# Patient Record
Sex: Female | Born: 1975
Health system: Southern US, Community
[De-identification: ages and names within clinical notes are randomized; demographics above are authoritative.]

## PROBLEM LIST (undated history)

## (undated) DIAGNOSIS — Z8632 Personal history of gestational diabetes: Secondary | ICD-10-CM

## (undated) DIAGNOSIS — M722 Plantar fascial fibromatosis: Secondary | ICD-10-CM

## (undated) HISTORY — DX: Personal history of gestational diabetes: Z86.32

## (undated) HISTORY — DX: Plantar fascial fibromatosis: M72.2

## (undated) HISTORY — PX: TUBAL LIGATION: SHX77

---

## 2003-03-19 ENCOUNTER — Other Ambulatory Visit: Admission: RE | Admit: 2003-03-19 | Discharge: 2003-03-19 | Payer: Self-pay | Admitting: Gynecology

## 2004-08-12 ENCOUNTER — Other Ambulatory Visit: Admission: RE | Admit: 2004-08-12 | Discharge: 2004-08-12 | Payer: Self-pay | Admitting: Gynecology

## 2005-08-16 ENCOUNTER — Other Ambulatory Visit: Admission: RE | Admit: 2005-08-16 | Discharge: 2005-08-16 | Payer: Self-pay | Admitting: Gynecology

## 2006-04-20 ENCOUNTER — Other Ambulatory Visit: Admission: RE | Admit: 2006-04-20 | Discharge: 2006-04-20 | Payer: Self-pay | Admitting: Gynecology

## 2006-08-14 ENCOUNTER — Encounter: Admission: RE | Admit: 2006-08-14 | Discharge: 2006-11-12 | Payer: Self-pay | Admitting: Gynecology

## 2006-11-06 ENCOUNTER — Inpatient Hospital Stay (HOSPITAL_COMMUNITY): Admission: AD | Admit: 2006-11-06 | Discharge: 2006-11-10 | Payer: Self-pay | Admitting: Gynecology

## 2006-11-07 ENCOUNTER — Encounter (INDEPENDENT_AMBULATORY_CARE_PROVIDER_SITE_OTHER): Payer: Self-pay | Admitting: Specialist

## 2006-12-12 ENCOUNTER — Other Ambulatory Visit: Admission: RE | Admit: 2006-12-12 | Discharge: 2006-12-12 | Payer: Self-pay | Admitting: Gynecology

## 2008-04-25 ENCOUNTER — Other Ambulatory Visit: Admission: RE | Admit: 2008-04-25 | Discharge: 2008-04-25 | Payer: Self-pay | Admitting: Gynecology

## 2008-05-08 ENCOUNTER — Ambulatory Visit: Payer: Self-pay | Admitting: Gynecology

## 2009-01-26 ENCOUNTER — Ambulatory Visit: Payer: Self-pay | Admitting: Gynecology

## 2009-05-22 ENCOUNTER — Encounter: Payer: Self-pay | Admitting: Gynecology

## 2009-05-22 ENCOUNTER — Ambulatory Visit: Payer: Self-pay | Admitting: Gynecology

## 2009-05-22 ENCOUNTER — Other Ambulatory Visit: Admission: RE | Admit: 2009-05-22 | Discharge: 2009-05-22 | Payer: Self-pay | Admitting: Gynecology

## 2009-12-21 ENCOUNTER — Ambulatory Visit: Payer: Self-pay | Admitting: Gynecology

## 2010-06-04 ENCOUNTER — Other Ambulatory Visit: Admission: RE | Admit: 2010-06-04 | Discharge: 2010-06-04 | Payer: Self-pay | Admitting: Gynecology

## 2010-06-04 ENCOUNTER — Ambulatory Visit: Payer: Self-pay | Admitting: Gynecology

## 2011-01-21 NOTE — H&P (Signed)
NAME:  Anne Calhoun, Anne Calhoun NO.:  [id number]   MEDICAL RECORD NO.:  [id number]          PATIENT TYPE:  INP   LOCATION:                                FACILITY:  [facility]   PHYSICIAN:  Juan H. Lily Peer, M.D.DATE OF BIRTH:  1976-06-20   DATE OF ADMISSION:  11/06/2006  DATE OF DISCHARGE:                              HISTORY & PHYSICAL   SCHEDULED DATE OF ADMISSION:  Monday, November 06, 2006   CHIEF COMPLAINT:  1. Term intrauterine pregnancy at [redacted] weeks gestation.  2. Gestational diabetes, diet control.   HISTORY:  The patient is a 35 year old gravida 1, para 0; estimated date  of confinement of November 06, 2006.  She will be admitted to [facility] on the evening of March 3 for cervical ripening followed by  Pitocin due to being [redacted] weeks gestation and gestational diabetic.  She  has been doing well with her diet and her blood sugars have been  monitored and followed closely with antepartum testing consisting of  weekly nonstress tests, and her last ultrasound done in the office on  February 29 demonstrated estimated fetal weight of 7 pounds 8 ounces in  the 38th percentile and AFI was normal, 12%, fetus in the vertex  presentation.  Cardiac activity was noted.  Her cervix was long, closed  and posterior.  The patient's prenatal course essentially otherwise had  been unremarkable.   PAST MEDICAL HISTORY:  The patient denies any allergies.   REVIEW OF SYSTEMS:  See [employer] form.   PHYSICAL EXAMINATION:  VITAL SIGNS:  The patient's blood pressure was  118/74, weight 226 pounds.  HEENT:  Unremarkable.  NECK:  Supple, trachea midline.  No carotid bruits, no thyromegaly.  LUNGS:  Clear to auscultation without rhonchi or wheezes.  HEART:  Regular rate and rhythm.  No murmurs or gallops.  BREAST:  Exam not done.  ABDOMEN:  Gravid uterus, vertex presentation by [facility] maneuver and  confirmed by ultrasound today.  PELVIC:  Cervix was long and closed and posterior.  EXTREMITIES:  DTRs 1+, negative clonus.   PRENATAL LABORATORY DATA:  The patient had a normal cystic fibrosis  screening as well as hemoglobin electrophoresis and norma alpha-  fetoprotein.  Her blood type is O positive, negative antibody screen,  VDRL was nonreactive, rubella immune, hepatitis B surface antigen and  HIV were negative, diabetes screen was abnormal as was a 3-hour GTT and  her GBS culture was positive.   ASSESSMENT:  A 35 year old gravida 1, para 0 at [redacted] weeks gestation will  be admitted on the evening of March 3 for cervical ripening, followed by  Pitocin the following morning.  The patient is a gestational diabetic,  diet control.  The risks, benefits, and pros and cons of induction were  discussed.  She does have positive group B strep culture for which she  will be place on penicillin for GBS prophylaxis.  She has no allergies.  The patient was informed that she  potentially could end up with a serial induction due to her cervix being  unfavorable.  All questions were answered and  will follow accordingly.   PLAN:  As per assessment above.      Juan H. Lily Peer, M.D.  Electronically Signed     JHF/MEDQ  D:  11/03/2006  T:  11/03/2006  Job:  [id number]

## 2011-01-21 NOTE — Discharge Summary (Signed)
NAME:  Anne Calhoun, Anne Calhoun NO.:  [id number]   MEDICAL RECORD NO.:  [id number]          PATIENT TYPE:  INP   LOCATION:  9126                          [facility]ACILITY:  [facility]   PHYSICIAN:  Juan H. Lily Peer, M.D.DATE OF BIRTH:  April 02, 1976   DATE OF ADMISSION:  11/06/2006  DATE OF DISCHARGE:  11/10/2006                               DISCHARGE SUMMARY   Total days hospitalized were four.   HISTORY:  The patient is a 35 year old gravida 1, now para 1, who was  admitted on March 3 for induction at 40 weeks' gestation, gestational  diabetes. The patient was taken for a primary cesarean section secondary  to nonreassuring fetal heart tracing (repetitive late deceleration). The  patient developed a female infant with Apgars of 9 and 9. There was a  nuchal cord at the time of delivery, and the baby weighed 6 pounds 11  ounces and an arterial cord pH of 7.18. Subserosal fibroids have been  noted at the time of cesarean section, and the patient's blood loss was  500 mL. Postoperatively, the patient due to her obesity may have had  difficulty with her O2 saturations. They were ranging in the 80s to 90s.  She was kept on O2 nasal cannula. Chest x-ray had been done, and  atelectasis was noted. She was given a touch of Lasix which increased  her urine output and improved her breathing, and the patient was  ambulating, voiding well, and tolerating a regular diet well. Her  electrolytes were fine as well, and she was ready to be discharged home  on her third postoperative day.   FINAL DIAGNOSES:  1. Post-dates pregnancy.  2. Gestational diabetes, diet controlled.  3. Postoperative atelectasis.   PROCEDURE PERFORMED:  1. Fetal monitoring.  2. Primary lower uterine segment transverse cesarean section.   FINAL DISPOSITION AND FOLLOWUP:  The patient will be discharged home on  third postoperative day. She will return back to the office next week to  have her staples removed. She was given a  prescription of Motrin 800 mg  to take 1 p.o. t.i.d. p.r.n. She is to continue her prenatal vitamins as  she is breast feeding, and I have given her a prescription also for  Tylox to take 1 p.o. q.4-6h. p.r.n. pain.      Juan H. Lily Peer, M.D.  Electronically Signed     JHF/MEDQ  D:  11/10/2006  T:  11/10/2006  Job:  [id number]

## 2011-01-21 NOTE — Op Note (Signed)
NAME:  Anne Calhoun, Anne Calhoun NO.:  [id number]   MEDICAL RECORD NO.:  [id number]          PATIENT TYPE:  INP   LOCATION:  9126                          [facility]ILITY[facility]  [facility]   PHYSICIAN:  Juan H. Lily Peer, M.D.DATE OF BIRTH:  [birth year]   DATE OF PROCEDURE:  11/07/2006  DATE OF DISCHARGE:                               OPERATIVE REPORT   INDICATION FOR OPERATION:  A 35 year old gravida 1, para 0, who was  admitted for induction.  She was at term at 32 weeks' gestation with  gestational diabetes, diet-controlled.  The patient is being taken to  the operating room secondary to non-reassuring fetal heart tracing  secondary to repetitive late decelerations.   PREOPERATIVE DIAGNOSES:  1. Postdate pregnancy.  2. Non-reassuring fetal heart rate tracing (repetitive late      decelerations).   POSTOPERATIVE DIAGNOSES:  1. Postdate pregnancy.  2. Non-reassuring fetal heart rate tracing (repetitive late      decelerations).   SURGEON:  Juan H. Lily Peer, M.D.   PROCEDURE PERFORMED:  Primary lower uterine segment transverse cesarean  section.   ANESTHESIA:  Epidural.   FINDINGS:  A viable female infant with a nuchal cord, Apgars of 9 and 9,  weight of 5 pounds 11 ounces, arterial cord pH of 7.18.  Clear amniotic  fluid.  Patient with several subserosal leiomyomas, especially in  anterior and posterior fundal uterus.  The patient did donate her cord  blood for public blood banking.   DESCRIPTION OF OPERATION:  After the patient was adequately counseled,  she was taken to the operating room, where she was taken immediately for  emergency cesarean section.  She was redosed for her epidural.  Foley  catheter was in place.  Fetal heart tones were in the 130s prior to  starting the cesarean section.  The scalp electrode was removed.  The  abdomen was prepped and draped in the usual sterile fashion.  A  Pfannenstiel skin incision was made 2 cm above the symphysis pubis.  The  incision was carried down from the skin and subcutaneous tissue down to  the rectus fascia, whereby a midline nick was made.  The fascia was  incised in a transverse fashion and the midline raphae was entered.  The  peritoneal cavity was entered cautiously.  The bladder flap was  established and the lower uterine segment was incised in a transverse  fashion.  Clear amniotic fluid was present.  The newborn's head was  delivered after manually reducing the nuchal cord.  The nasopharyngeal  area was bulb-suctioned and the baby was delivered.  The cord was doubly  clamped and excised and passed off to the neonatologists who were in  attendance, who gave the above-mentioned parameters.  After cord blood  was obtained, the placenta was delivered from the intrauterine cavity  and was submitted for histological evaluation.  Pitocin drip was started  and the patient had received a gram of cefoxitin.  The uterus was  exteriorized.  The intrauterine cavity was swept clear of remaining  products of conception.  The lower uterine segment transverse incision  was closed in a double-layered  fashion, the first with an interlocking  stitch of 0 Vicryl suture, the second layer in an imbricating manner  with 0 Vicryl suture.  The tubes and ovaries appeared to be normal.  The  uterus was placed back into the pelvic cavity.  The pelvic cavity was  copiously irrigated with normal saline solution.  After ascertaining  adequate hemostasis, closure was started.  The visceroperitoneum was not  closed, but the rectus fascia was closed with a running stitch of 0  Vicryl.  The  subcutaneous bleeders were bovie-cauterized.  The skin was  reapproximated with skin clips followed by placement of Xeroform gauze  and 4 x 8 dressing.  The patient was transferred to recovery room with  stable vital signs.  Blood loss from procedure was 500 mL.  IV fluids  were 2 L of lactated Ringer's.  Urine output was 100 mL.      Juan  H. Lily Peer, M.D.  Electronically Signed     JHF/MEDQ  D:  11/08/2006  T:  11/08/2006  Job:  [id number]

## 2011-05-31 ENCOUNTER — Ambulatory Visit (INDEPENDENT_AMBULATORY_CARE_PROVIDER_SITE_OTHER): Payer: 59 | Admitting: Gynecology

## 2011-05-31 ENCOUNTER — Encounter: Payer: Self-pay | Admitting: Gynecology

## 2011-05-31 ENCOUNTER — Encounter: Payer: Self-pay | Admitting: Anesthesiology

## 2011-05-31 VITALS — BP 124/86

## 2011-05-31 DIAGNOSIS — Z3201 Encounter for pregnancy test, result positive: Secondary | ICD-10-CM

## 2011-05-31 DIAGNOSIS — N912 Amenorrhea, unspecified: Secondary | ICD-10-CM

## 2011-05-31 LAB — POCT URINE PREGNANCY: Preg Test, Ur: POSITIVE

## 2011-05-31 NOTE — Patient Instructions (Signed)
Adisa, and granulations on your pregnancy once again. The blood test that your going to have drawn today the quantitative beta-hCG is to give [place] an idea how far your pregnancy is so that we can do an ultrasound and you that the pregnancy is in the uterus and given [place] a clear understanding of the due date. Based on your last menstrual period of August 21 year approximately 5-1/2 weeks in tear pregnancy with an estimated date of confinement or due date of 01/31/2012. We'll see the following week for the ultrasound and continue to take her prenatal vitamins.

## 2011-05-31 NOTE — Progress Notes (Signed)
Addended byCammie Mcgee T on: 05/31/2011 01:06 PM   Modules accepted: Orders

## 2011-05-31 NOTE — Progress Notes (Signed)
Patient's a 35 year old now gravida 2 para 1 who stated her last menstrual period was 04/26/2011. She had been on prenatal vitamins and was utilizing the ovulation predictor kit timed intercourse. Based on her last menstrual period she would be 5-1/2 weeks and her pregnancy with a due date of May 28 2013th. Review of her record indicated she had gestational diabetes with her last pregnancy and had delivered via cesarean section as a result of postdate pregnancy. She had delivered a viable female infant with a weight of 6 lbs. 11 oz. Patient denies any nausea vomiting any vaginal bleeding or any breast tenderness.  Exam: Abdomen: Soft nontender no rebound or guarding Bartholin urethra Skene glands: Unremarkable Vagina: No lesions or discharge Cervix: No lesion discharge Uterus: 4-6 weeks size uterus nontender Adnexa: No palpable masses or tenderness Rectal: Not done  Assessment/plan: Pregnant first trimester 5-1/2 weeks estimated gestational age. We'll obtain a quantitative beta-hCG and plan for an ultrasound next week to confirm dates and viability.

## 2011-06-07 ENCOUNTER — Other Ambulatory Visit: Payer: Self-pay | Admitting: Gynecology

## 2011-06-07 ENCOUNTER — Ambulatory Visit (INDEPENDENT_AMBULATORY_CARE_PROVIDER_SITE_OTHER): Payer: 59

## 2011-06-07 ENCOUNTER — Encounter: Payer: Self-pay | Admitting: Gynecology

## 2011-06-07 ENCOUNTER — Ambulatory Visit (INDEPENDENT_AMBULATORY_CARE_PROVIDER_SITE_OTHER): Payer: 59 | Admitting: Gynecology

## 2011-06-07 VITALS — BP 128/84

## 2011-06-07 DIAGNOSIS — N852 Hypertrophy of uterus: Secondary | ICD-10-CM

## 2011-06-07 DIAGNOSIS — N854 Malposition of uterus: Secondary | ICD-10-CM

## 2011-06-07 DIAGNOSIS — Z3201 Encounter for pregnancy test, result positive: Secondary | ICD-10-CM

## 2011-06-07 DIAGNOSIS — Z23 Encounter for immunization: Secondary | ICD-10-CM

## 2011-06-07 DIAGNOSIS — N912 Amenorrhea, unspecified: Secondary | ICD-10-CM

## 2011-06-07 DIAGNOSIS — N831 Corpus luteum cyst of ovary, unspecified side: Secondary | ICD-10-CM

## 2011-06-07 DIAGNOSIS — O34519 Maternal care for incarceration of gravid uterus, unspecified trimester: Secondary | ICD-10-CM

## 2011-06-07 DIAGNOSIS — O9989 Other specified diseases and conditions complicating pregnancy, childbirth and the puerperium: Secondary | ICD-10-CM

## 2011-06-07 DIAGNOSIS — O34539 Maternal care for retroversion of gravid uterus, unspecified trimester: Secondary | ICD-10-CM

## 2011-06-07 DIAGNOSIS — N83209 Unspecified ovarian cyst, unspecified side: Secondary | ICD-10-CM

## 2011-06-07 LAB — US OB TRANSVAGINAL

## 2011-06-07 NOTE — Progress Notes (Signed)
Patient's a 35 year old now gravida 2 para 1 who stated her last menstrual period was 04/26/2011. She had been on prenatal vitamins and was utilizing the ovulation predictor kit timed intercourse. Based on her last menstrual period she would be 5-1/2 weeks and her pregnancy with a due date of May 28 2013th. Review of her record indicated she had gestational diabetes with her last pregnancy and had delivered via cesarean section as a result of postdate pregnancy. She had delivered a viable female infant with a weight of 6 lbs. 11 oz. Patient denies any nausea vomiting any vaginal bleeding or any breast tenderness.  Patient return to the office today for an ultrasound to determine viability and correct estimated gestational age. She denied any vaginal bleeding. Her quantitative beta hCG of September 25 was 1113 milli-international units per mL. The ultrasound today demonstrated the following  Intrauterine pregnancy was noted with fetal pole and cardiac activity seen. Fetal pole size less than dates by last menstrual period. Left ovary small corpus luteum cyst measuring 24 x 21 mm and a thin walled echo free cyst measuring 4.7 x 3.8 x 4.2 cm negative color flow Doppler cervix was long and closed.  The above findings discussed with the patient. We'll refer patient to my obstetrical colleagues who will follow her during her pregnancy and we'll be monitoring this ovarian cyst. Patient will continue her prenatal vitamins. She'll make the OB appointment 2 weeks from today. We'll send a copy of this office note to my schedule colleagues.

## 2011-06-09 ENCOUNTER — Telehealth: Payer: Self-pay | Admitting: *Deleted

## 2011-06-09 NOTE — Telephone Encounter (Signed)
Pt called complaining of soreness and slight redress from her flu shot on 06/07/11. Lm on pt voice mail that this is normal after getting shot.

## 2011-06-20 LAB — OB RESULTS CONSOLE ANTIBODY SCREEN: Antibody Screen: NEGATIVE

## 2011-06-20 LAB — OB RESULTS CONSOLE HIV ANTIBODY (ROUTINE TESTING): HIV: NONREACTIVE

## 2011-06-20 LAB — OB RESULTS CONSOLE RPR: RPR: NONREACTIVE

## 2011-06-20 LAB — OB RESULTS CONSOLE GC/CHLAMYDIA
Chlamydia: NEGATIVE
Gonorrhea: NEGATIVE

## 2011-07-26 ENCOUNTER — Other Ambulatory Visit (HOSPITAL_COMMUNITY): Payer: Self-pay | Admitting: Obstetrics and Gynecology

## 2011-07-26 DIAGNOSIS — O289 Unspecified abnormal findings on antenatal screening of mother: Secondary | ICD-10-CM

## 2011-08-11 ENCOUNTER — Telehealth: Payer: Self-pay | Admitting: *Deleted

## 2011-08-11 NOTE — Telephone Encounter (Signed)
Left pt messages with 12-7 appointment time and address with call back number. Faxed letter to referring

## 2011-08-11 NOTE — Telephone Encounter (Signed)
Per Sue Lush referring Pt is aware of 12-7 appointment

## 2011-08-12 ENCOUNTER — Other Ambulatory Visit: Payer: Self-pay

## 2011-08-12 ENCOUNTER — Ambulatory Visit (HOSPITAL_BASED_OUTPATIENT_CLINIC_OR_DEPARTMENT_OTHER): Payer: 59 | Admitting: Hematology & Oncology

## 2011-08-12 ENCOUNTER — Ambulatory Visit (HOSPITAL_BASED_OUTPATIENT_CLINIC_OR_DEPARTMENT_OTHER): Payer: 59

## 2011-08-12 ENCOUNTER — Other Ambulatory Visit (HOSPITAL_BASED_OUTPATIENT_CLINIC_OR_DEPARTMENT_OTHER): Payer: 59 | Admitting: Lab

## 2011-08-12 VITALS — BP 117/84 | HR 70 | Temp 98.2°F | Ht 60.0 in | Wt 217.0 lb

## 2011-08-12 DIAGNOSIS — D72829 Elevated white blood cell count, unspecified: Secondary | ICD-10-CM

## 2011-08-12 DIAGNOSIS — O289 Unspecified abnormal findings on antenatal screening of mother: Secondary | ICD-10-CM

## 2011-08-12 DIAGNOSIS — O99891 Other specified diseases and conditions complicating pregnancy: Secondary | ICD-10-CM

## 2011-08-12 LAB — CBC WITH DIFFERENTIAL (CANCER CENTER ONLY)
BASO#: 0 10*3/uL (ref 0.0–0.2)
Eosinophils Absolute: 0.1 10*3/uL (ref 0.0–0.5)
HCT: 39 % (ref 34.8–46.6)
LYMPH%: 17.4 % (ref 14.0–48.0)
MCH: 31.3 pg (ref 26.0–34.0)
MCHC: 34.6 g/dL (ref 32.0–36.0)
MONO#: 0.7 10*3/uL (ref 0.1–0.9)
MONO%: 3.6 % (ref 0.0–13.0)
Platelets: 309 10*3/uL (ref 145–400)
RDW: 12.3 % (ref 11.1–15.7)

## 2011-08-12 NOTE — Progress Notes (Signed)
This office note has been dictated.

## 2011-08-18 NOTE — Progress Notes (Signed)
CC:   Maxie Better, M.D.  DIAGNOSES: 1. Leukocytosis. 2. Second trimester pregnancy.  HISTORY OF PRESENT ILLNESS:  Ms. Hogue is a very nice 35 year old African American female.  She is followed by Dr. Maxie Better. She is in her 15th week of pregnancy.  This is her 2nd pregnancy. Her 1st pregnancy was 4 years ago.  She had no problems with that pregnancy. I think she had borderline diabetes.  Her baby, a girl, was of normal birth weight.  With this pregnancy, Dr. Cherly Hensen has noted a leukocytosis.  As such, she felt that a Hematology evaluation was indicated.  Back in October, a CBC was done which showed a white count 17.6. Hemoglobin was 14 and platelet count 364.  MCV is 92.  White cell differential showed 80 segs, 16 lymphocytes.  She was negative for HIV. She was negative for hepatitis B.  She did have a followup CBC done.  This was done in November.  This showed a white count of 15.9, hemoglobin 13.6, hematocrit  39, and platelet count 347.  White cell differential was 77 segs, 18 lymphocytes.  Ms. Grieshop was never told that her white cell count has been elevated. She was not sure if there was any issue with her 1st pregnancy.  She has had no problem with fever.  She has had no rashes.  She has had no change in bowel or bladder habits.  She only on prenatal vitamins. She has had no swallowing difficulties.  There is no headache.  She has had no swelling of her joints. As such, Dr. Cherly Hensen felt that a Hematologic evaluation was indicated and kindly referred Ms. Neaves to the [employer].  PAST MEDICAL HISTORY:  Ms. Sheller past medical history is remarkable for gestational diabetes.  ALLERGIES:  Penicillin.  CURRENT MEDICATIONS:  Prenatal vitamins daily.  SOCIAL HISTORY:  Negative for tobacco use.  There is no alcohol use. She works for [employer].  FAMILY HISTORY:  Remarkable for maternal grandmother who passed  away from breast cancer before Ms. Aydelotte was born.  There is a history of hypertension with 1 sister and a brain aneurysm that led to the passing of in another sister.  REVIEW OF SYSTEMS:  Is as stated in the history of present illness.  No additional findings noted on a 12-system review.  PHYSICAL EXAM:  General: This is a pregnant, somewhat obese white female in no obvious distress.  Vital Signs: Show temperature of 98.2, pulse 70, respiratory 18, and blood pressure 117/84.  Weight is 217.  Head and neck exam shows a normocephalic, atraumatic skull.  There are no ocular or oral lesions.  She has no adenopathy in her neck.  Thyroid is not palpable.  Lungs are clear to percussion and auscultation bilaterally. There are no rales, wheezes, or rhonchi.  Cardiac exam is regular rate and rhythm with a normal S1 and S2.  There are no murmurs, rubs, or bruits.  Abdominal exam: Soft with good bowel sounds.  There is no palpable abdominal mass.  She really is not showing her pregnancy. There is no palpable hepatosplenomegaly.  Back exam:  No tenderness over the spine, ribs, or hips.  Extremities showed no clubbing, cyanosis, or edema.  She has good range of motion of her joints.  There is no joint erythema, warmth, or swelling.  Skin exam: No rashes, ecchymosis, or petechia.  Neurological exam: Shows no focal neurological deficits.  LABORATORY STUDIES:  White cell count is 18.1, hemoglobin 13.5,  hematocrit 39, and platelet count 309.  MCV is 91.  White cell differential shows 70 segs, 16 78 segs, 17 lymphocytes, and 4 monos.  Peripheral smear shows a normochromic, normocytic population of red blood cells.  There are no nucleated red blood cells.  I see no teardrop cells.  She has no Rouleaux formation.  There are no target cells.  I see no schistocytes or spherocytes.  White cells are increased in number.  The majority are mature polys.  I do not see any hypersegmented polys.  She has a  couple of monocytes.  Again, monocytes appear mature. They are a couple of large lymphocytes.  I do not see any immature myeloid cells.  There is no blasts.  Platelets are adequate in number and size.  IMPRESSION:  Ms. Dermody is a very nice 35 year old African American female with her 2nd pregnancy.  She has leukocytosis.  By the blood smear, the leukocytosis appears to be reactive.  I do not find anything on her physical exam that would suggest an underlying myeloproliferative process.  Possibly some type of subclinical infection.  I know that she has had some urinary tract infections in the past.  There are some articles regarding leukocytosis in pregnancy.  One article out of the archives of Gynecology and Obstetrics (June 24, 2011) does show that leukocytosis in the 1st trimester of pregnancy is associated with an increased risk of obstetrical complications.  I wonder if this might apply to Ms. Sebree.  I think it is going to be interesting to see how her white cell count trends when we see her back.  Again, by the blood smear, the leukocytosis appears to be reactive.  If we do, find, however, that her white cell count continues to elevate, then we are going to have to run more sophisticated tests and make sure that we are not looking at some type of a myeloproliferative process.  I went over Ms. Broadwater's lab work with her.  I did give her a prayer blanket,  which she liked it quite a bit.  She does have a strong faith.  We will get her back in a month for follow up.  Again, we will have to see how her white cell count trends during this next month.  I spent about an hour and 20 minutes or so with Ms. Marken.  Again, we had good fellowship.   ______________________________ Josph Macho, M.D. PRE/MEDQ  D:  08/12/2011  T:  08/12/2011  Job:  [id number]

## 2011-08-23 ENCOUNTER — Ambulatory Visit (HOSPITAL_COMMUNITY)
Admission: RE | Admit: 2011-08-23 | Discharge: 2011-08-23 | Disposition: A | Discharge status: Home / Self Care | Payer: 59 | Source: Ambulatory Visit | Attending: Obstetrics and Gynecology | Admitting: Obstetrics and Gynecology

## 2011-08-23 DIAGNOSIS — O358XX0 Maternal care for other (suspected) fetal abnormality and damage, not applicable or unspecified: Secondary | ICD-10-CM | POA: Insufficient documentation

## 2011-08-23 DIAGNOSIS — E669 Obesity, unspecified: Secondary | ICD-10-CM | POA: Insufficient documentation

## 2011-08-23 DIAGNOSIS — Z1389 Encounter for screening for other disorder: Secondary | ICD-10-CM | POA: Insufficient documentation

## 2011-08-23 DIAGNOSIS — O09299 Supervision of pregnancy with other poor reproductive or obstetric history, unspecified trimester: Secondary | ICD-10-CM | POA: Insufficient documentation

## 2011-08-23 DIAGNOSIS — O99210 Obesity complicating pregnancy, unspecified trimester: Secondary | ICD-10-CM | POA: Insufficient documentation

## 2011-08-23 DIAGNOSIS — O34219 Maternal care for unspecified type scar from previous cesarean delivery: Secondary | ICD-10-CM | POA: Insufficient documentation

## 2011-08-23 DIAGNOSIS — Z363 Encounter for antenatal screening for malformations: Secondary | ICD-10-CM | POA: Insufficient documentation

## 2011-08-23 NOTE — Progress Notes (Signed)
Genetic Counseling  High-Risk Gestation Note  Appointment Date:  08/23/2011 Referred By: Serita Kyle, * Date of Birth:  Jul 23, 1976 Partner:  Noberto Retort   Pregnancy History: G2P1 Estimated Date of Delivery: 01/31/12 Estimated Gestational Age: [redacted]w[redacted]d Attending: Particia Nearing, MD   Anne Calhoun and her husband, Mr. Svara Twyman, were seen for genetic counseling because of an increased risk for fetal Down syndrome based on First Trimester Screening performed through [facility].  They were counseled regarding the First trimester screen result and the associated 1 in 53 risk for fetal Down syndrome.  We reviewed chromosomes, nondisjunction, and the common features and variable prognosis of Down syndrome.  In addition, we reviewed the screen adjusted reduction in risks for trisomy 18 (1 in 699 to less than 1 in 10,000).  We also discussed other explanations for a screen positive result including:  differences in maternal metabolism, and normal variation.  We reviewed other available screening and diagnostic options including detailed ultrasound and amniocentesis.  We discussed the risks, limitations, and benefits of each.  We discussed another type of screening test, noninvasive prenatal testing (NIPT), which utilizes cell free fetal DNA found in the maternal circulation. This test is not diagnostic for chromosome conditions, but can provide information regarding the presence or absence of extra fetal DNA for chromosomes 13, 18 and 21. The reported detection rate is greater than 99% for Trisomy 21, greater than 97% for Trisomy 18, and is approximately 80% (8 out of 10) for Trisomy 13. The false positive rate is thought to be less than 1% for any of these conditions. After thoughtful consideration of these options, Anne Calhoun elected to have ultrasound only, but declined amniocentesis and NIPT.  They understand that ultrasound cannot rule out all birth defects or genetic  syndromes.  The patient was advised of this limitation and states she still does not want diagnostic testing at this time.   However, they were counseled that 50-80% of fetuses with Down syndrome, when well visualized, have detectable anomalies or soft markers by ultrasound.  A complete ultrasound was performed today.  The ultrasound report will be sent under separate cover. A follow-up ultrasound was planned on 09/13/11 to complete fetal anatomic survey.   Both family histories were reviewed and found to be noncontributory for birth defects, mental retardation, recurrent pregnancy loss, or known genetic conditions. Without further information regarding the provided family history, an accurate genetic risk cannot be calculated. Further genetic counseling is warranted if more information is obtained.  Anne Calhoun denied exposure to environmental toxins or chemical agents. She denied the use of alcohol, tobacco or street drugs. She denied significant viral illnesses during the course of her pregnancy. Her medical and surgical histories were noncontributory.   I counseled this couple for approximately 25 minutes regarding the above risks and available options.     Quinn Plowman, MS,  Certified Genetic Counselor 08/23/2011

## 2011-08-24 ENCOUNTER — Other Ambulatory Visit: Payer: Self-pay | Admitting: Gynecology

## 2011-08-24 ENCOUNTER — Other Ambulatory Visit (HOSPITAL_COMMUNITY): Payer: Self-pay | Admitting: Gynecology

## 2011-09-12 ENCOUNTER — Other Ambulatory Visit: Payer: Self-pay | Admitting: Gynecology

## 2011-09-12 DIAGNOSIS — N912 Amenorrhea, unspecified: Secondary | ICD-10-CM

## 2011-09-12 DIAGNOSIS — O9989 Other specified diseases and conditions complicating pregnancy, childbirth and the puerperium: Secondary | ICD-10-CM

## 2011-09-12 DIAGNOSIS — N831 Corpus luteum cyst of ovary, unspecified side: Secondary | ICD-10-CM

## 2011-09-12 DIAGNOSIS — O34539 Maternal care for retroversion of gravid uterus, unspecified trimester: Secondary | ICD-10-CM

## 2011-09-12 DIAGNOSIS — O34519 Maternal care for incarceration of gravid uterus, unspecified trimester: Secondary | ICD-10-CM

## 2011-09-12 DIAGNOSIS — N83209 Unspecified ovarian cyst, unspecified side: Secondary | ICD-10-CM

## 2011-09-13 ENCOUNTER — Ambulatory Visit (HOSPITAL_COMMUNITY)
Admission: RE | Admit: 2011-09-13 | Discharge: 2011-09-13 | Disposition: A | Discharge status: Home / Self Care | Payer: 59 | Source: Ambulatory Visit | Attending: Obstetrics and Gynecology | Admitting: Obstetrics and Gynecology

## 2011-09-13 ENCOUNTER — Encounter (HOSPITAL_COMMUNITY): Payer: Self-pay

## 2011-09-13 DIAGNOSIS — E669 Obesity, unspecified: Secondary | ICD-10-CM | POA: Insufficient documentation

## 2011-09-13 DIAGNOSIS — O09299 Supervision of pregnancy with other poor reproductive or obstetric history, unspecified trimester: Secondary | ICD-10-CM | POA: Insufficient documentation

## 2011-09-13 DIAGNOSIS — N831 Corpus luteum cyst of ovary, unspecified side: Secondary | ICD-10-CM

## 2011-09-13 DIAGNOSIS — O9989 Other specified diseases and conditions complicating pregnancy, childbirth and the puerperium: Secondary | ICD-10-CM

## 2011-09-13 DIAGNOSIS — O34539 Maternal care for retroversion of gravid uterus, unspecified trimester: Secondary | ICD-10-CM

## 2011-09-13 DIAGNOSIS — O99210 Obesity complicating pregnancy, unspecified trimester: Secondary | ICD-10-CM | POA: Insufficient documentation

## 2011-09-13 DIAGNOSIS — O34219 Maternal care for unspecified type scar from previous cesarean delivery: Secondary | ICD-10-CM | POA: Insufficient documentation

## 2011-09-13 DIAGNOSIS — O34519 Maternal care for incarceration of gravid uterus, unspecified trimester: Secondary | ICD-10-CM

## 2011-09-13 DIAGNOSIS — N83209 Unspecified ovarian cyst, unspecified side: Secondary | ICD-10-CM

## 2011-09-13 DIAGNOSIS — N912 Amenorrhea, unspecified: Secondary | ICD-10-CM

## 2011-09-13 NOTE — Progress Notes (Signed)
Ms. Scheel was seen for ultrasound appointment today.  Please see [facility] report for details.

## 2011-09-14 ENCOUNTER — Other Ambulatory Visit: Payer: Self-pay

## 2011-09-26 ENCOUNTER — Ambulatory Visit (HOSPITAL_BASED_OUTPATIENT_CLINIC_OR_DEPARTMENT_OTHER): Payer: 59 | Admitting: Hematology & Oncology

## 2011-09-26 ENCOUNTER — Other Ambulatory Visit (HOSPITAL_BASED_OUTPATIENT_CLINIC_OR_DEPARTMENT_OTHER): Payer: 59 | Admitting: Lab

## 2011-09-26 VITALS — BP 124/83 | HR 117 | Temp 97.0°F | Ht 60.0 in | Wt 219.0 lb

## 2011-09-26 DIAGNOSIS — O99891 Other specified diseases and conditions complicating pregnancy: Secondary | ICD-10-CM

## 2011-09-26 DIAGNOSIS — D72829 Elevated white blood cell count, unspecified: Secondary | ICD-10-CM

## 2011-09-26 LAB — CBC WITH DIFFERENTIAL (CANCER CENTER ONLY)
BASO#: 0 10*3/uL (ref 0.0–0.2)
HCT: 39.8 % (ref 34.8–46.6)
LYMPH#: 2.8 10*3/uL (ref 0.9–3.3)
MCH: 30.5 pg (ref 26.0–34.0)
MCHC: 33.9 g/dL (ref 32.0–36.0)
MCV: 90 fL (ref 81–101)
MONO#: 0.7 10*3/uL (ref 0.1–0.9)
MONO%: 4.3 % (ref 0.0–13.0)
NEUT#: 12.7 10*3/uL — ABNORMAL HIGH (ref 1.5–6.5)
RBC: 4.42 10*6/uL (ref 3.70–5.32)

## 2011-09-26 NOTE — Progress Notes (Signed)
This office note has been dictated.

## 2011-09-26 NOTE — Progress Notes (Signed)
CC:   Anne Calhoun, M.D.  DIAGNOSES: 1. Leukocytosis, likely reactive. 2. Second-trimester pregnancy.  CURRENT THERAPY:  Observation.  INTERIM HISTORY:  Anne Calhoun comes in for followup.  When we first saw her, we did a workup for the leukocytosis.  So far, her results have all been negative.  We have not uncovered any etiology for the leukocytosis.  Her blood smear looked pretty much reactive. She is doing well.  Her pregnancy is going along nicely.  She is still working.  She has not noticed any problems with fevers, sweats or chills.  She does have some urinary frequency.  She has not had any rashes.  There has been no pruritus.  There has been no cough or shortness of breath.  PHYSICAL EXAM:  General:  This is a well-developed, well-nourished, black female who is pregnant.  Vital signs:  97, pulse is 100, respiratory rate 16, blood pressure 124/83.  Weight is 219.  Head and neck:  Exam shows a normocephalic, atraumatic skull.  There are no ocular or oral lesions.  There are no palpable cervical, supraclavicular lymph nodes.  Lungs:  Clear bilaterally.  Cardiac:  Regular rate and rhythm with normal S1 and S2.  There are no murmurs, rubs, or bruits. Abdomen:  Soft with good bowel sounds.  There is no palpable abdominal mass.  There is no fluid wave.  There is no palpable hepatosplenomegaly. Back:  No tenderness over the spine, ribs, or hips.  Extremities:  Shows no clubbing, cyanosis or edema.  Skin:  No rash, ecchymosis or petechia. Neurological:  Exam shows no focal neurological deficits.  LABORATORY STUDIES:  White cell count 16.3, hemoglobin 13.5, hematocrit 39.8, platelet count 279.  White cell differential shows 78 segs, 17 lymphocytes, 4 monos.  Peripheral smear shows good maturation of her white blood cells.  There are no immature myeloid cells.  There are no hypersegmented polys.  I see no atypical lymphocytes.  Red cells are without nucleated red  blood cells.  There are no teardrop cells.  Platelets are adequate in number and size.  IMPRESSION:  Anne Calhoun is a 36 year old African American female with leukocytosis.  Her white cell count is a little bit Calhoun.  It is definitely not going up.  I do not see that we need to do a bone marrow test on her.  I just feel that by looking at her blood smear, that the white cells do appear somewhat reactive.  I would like to see her back in 2 months' time now.  Hopefully we will find that her white cell count stabilizes or even continues to improve.  So far, I have found nothing on her exam that would suggest a myeloproliferative process.  Her blood smear, again, does not look suspicious.    ______________________________ Josph Macho, M.D. PRE/MEDQ  D:  09/26/2011  T:  09/26/2011  Job:  [id number]

## 2011-09-28 ENCOUNTER — Ambulatory Visit: Payer: 59 | Admitting: Hematology & Oncology

## 2011-09-28 ENCOUNTER — Other Ambulatory Visit: Payer: 59 | Admitting: Lab

## 2011-11-22 ENCOUNTER — Telehealth: Payer: Self-pay | Admitting: Hematology & Oncology

## 2011-11-22 NOTE — Telephone Encounter (Signed)
Pt cx 11-28-11 did not want to reschedule. RN aware

## 2011-11-28 ENCOUNTER — Other Ambulatory Visit: Payer: 59 | Admitting: Lab

## 2011-11-28 ENCOUNTER — Ambulatory Visit: Payer: 59 | Admitting: Hematology & Oncology

## 2012-01-11 ENCOUNTER — Encounter (HOSPITAL_COMMUNITY): Payer: Self-pay | Admitting: Pharmacist

## 2012-01-19 ENCOUNTER — Inpatient Hospital Stay (HOSPITAL_COMMUNITY)
Admission: AD | Admit: 2012-01-19 | Discharge: 2012-01-22 | DRG: 765 | Disposition: A | Discharge status: Home / Self Care | Payer: 59 | Source: Ambulatory Visit | Attending: Obstetrics and Gynecology | Admitting: Obstetrics and Gynecology

## 2012-01-19 ENCOUNTER — Encounter (HOSPITAL_COMMUNITY): Payer: Self-pay | Admitting: *Deleted

## 2012-01-19 ENCOUNTER — Inpatient Hospital Stay (HOSPITAL_COMMUNITY): Payer: 59 | Admitting: Anesthesiology

## 2012-01-19 ENCOUNTER — Encounter (HOSPITAL_COMMUNITY): Payer: Self-pay | Admitting: Anesthesiology

## 2012-01-19 ENCOUNTER — Encounter (HOSPITAL_COMMUNITY): Payer: Self-pay

## 2012-01-19 ENCOUNTER — Other Ambulatory Visit: Payer: Self-pay | Admitting: Obstetrics and Gynecology

## 2012-01-19 ENCOUNTER — Encounter (HOSPITAL_COMMUNITY)
Admission: AD | Disposition: A | Discharge status: Home / Self Care | Payer: Self-pay | Source: Ambulatory Visit | Attending: Obstetrics and Gynecology

## 2012-01-19 DIAGNOSIS — E669 Obesity, unspecified: Secondary | ICD-10-CM | POA: Diagnosis present

## 2012-01-19 DIAGNOSIS — O34219 Maternal care for unspecified type scar from previous cesarean delivery: Principal | ICD-10-CM | POA: Diagnosis present

## 2012-01-19 DIAGNOSIS — R0902 Hypoxemia: Secondary | ICD-10-CM

## 2012-01-19 DIAGNOSIS — O99893 Other specified diseases and conditions complicating puerperium: Secondary | ICD-10-CM | POA: Diagnosis not present

## 2012-01-19 DIAGNOSIS — O99814 Abnormal glucose complicating childbirth: Secondary | ICD-10-CM | POA: Diagnosis present

## 2012-01-19 DIAGNOSIS — Z3201 Encounter for pregnancy test, result positive: Secondary | ICD-10-CM

## 2012-01-19 DIAGNOSIS — Z22330 Carrier of Group B streptococcus: Secondary | ICD-10-CM

## 2012-01-19 DIAGNOSIS — J9811 Atelectasis: Secondary | ICD-10-CM

## 2012-01-19 DIAGNOSIS — J9819 Other pulmonary collapse: Secondary | ICD-10-CM | POA: Diagnosis not present

## 2012-01-19 DIAGNOSIS — O99892 Other specified diseases and conditions complicating childbirth: Secondary | ICD-10-CM | POA: Diagnosis present

## 2012-01-19 LAB — CBC
HCT: 39.1 % (ref 36.0–46.0)
Hemoglobin: 13.1 g/dL (ref 12.0–15.0)
RDW: 14.1 % (ref 11.5–15.5)
WBC: 10.4 10*3/uL (ref 4.0–10.5)

## 2012-01-19 SURGERY — Surgical Case
Anesthesia: Spinal | Wound class: Clean Contaminated

## 2012-01-19 MED ORDER — FAMOTIDINE IN NACL 20-0.9 MG/50ML-% IV SOLN
20.0000 mg | Freq: Once | INTRAVENOUS | Status: AC
  Administered 2012-01-19: 20 mg via INTRAVENOUS
  Filled 2012-01-19: qty 50

## 2012-01-19 MED ORDER — DIPHENHYDRAMINE HCL 50 MG/ML IJ SOLN: 25.0000 mg | INTRAMUSCULAR | Status: DC | PRN

## 2012-01-19 MED ORDER — MEPERIDINE HCL 25 MG/ML IJ SOLN: 6.2500 mg | INTRAMUSCULAR | Status: DC | PRN

## 2012-01-19 MED ORDER — BUPIVACAINE IN DEXTROSE 0.75-8.25 % IT SOLN
INTRATHECAL | Status: DC | PRN
  Administered 2012-01-19: 1.5 mL via INTRATHECAL

## 2012-01-19 MED ORDER — DIBUCAINE 1 % RE OINT: 1.0000 "application " | TOPICAL_OINTMENT | RECTAL | Status: DC | PRN

## 2012-01-19 MED ORDER — SCOPOLAMINE 1 MG/3DAYS TD PT72
MEDICATED_PATCH | TRANSDERMAL | Status: AC
  Administered 2012-01-19: 1.5 mg via TRANSDERMAL
  Filled 2012-01-19: qty 1

## 2012-01-19 MED ORDER — SODIUM CHLORIDE 0.9 % IJ SOLN: 3.0000 mL | INTRAMUSCULAR | Status: DC | PRN

## 2012-01-19 MED ORDER — MENTHOL 3 MG MT LOZG: 1.0000 | LOZENGE | OROMUCOSAL | Status: DC | PRN

## 2012-01-19 MED ORDER — SCOPOLAMINE 1 MG/3DAYS TD PT72
1.0000 | MEDICATED_PATCH | Freq: Once | TRANSDERMAL | Status: DC
  Administered 2012-01-19: 1.5 mg via TRANSDERMAL

## 2012-01-19 MED ORDER — SENNOSIDES-DOCUSATE SODIUM 8.6-50 MG PO TABS
2.0000 | ORAL_TABLET | Freq: Every day | ORAL | Status: DC
  Administered 2012-01-19 – 2012-01-21 (×3): 2 via ORAL

## 2012-01-19 MED ORDER — PROMETHAZINE HCL 25 MG/ML IJ SOLN: 6.2500 mg | INTRAMUSCULAR | Status: DC | PRN

## 2012-01-19 MED ORDER — EPHEDRINE 5 MG/ML INJ
INTRAVENOUS | Status: AC
  Filled 2012-01-19: qty 10

## 2012-01-19 MED ORDER — ACETAMINOPHEN 10 MG/ML IV SOLN: 1000.0000 mg | Freq: Four times a day (QID) | INTRAVENOUS | Status: AC | PRN

## 2012-01-19 MED ORDER — EPHEDRINE SULFATE 50 MG/ML IJ SOLN
INTRAMUSCULAR | Status: DC | PRN
  Administered 2012-01-19: 5 mg via INTRAVENOUS
  Administered 2012-01-19: 10 mg via INTRAVENOUS

## 2012-01-19 MED ORDER — FERROUS SULFATE 325 (65 FE) MG PO TABS
325.0000 mg | ORAL_TABLET | Freq: Two times a day (BID) | ORAL | Status: DC
  Administered 2012-01-20 – 2012-01-22 (×5): 325 mg via ORAL
  Filled 2012-01-19 (×5): qty 1

## 2012-01-19 MED ORDER — METHYLERGONOVINE MALEATE 0.2 MG/ML IJ SOLN: 0.2000 mg | INTRAMUSCULAR | Status: DC | PRN

## 2012-01-19 MED ORDER — GENTAMICIN SULFATE 40 MG/ML IJ SOLN
INTRAVENOUS | Status: AC
  Administered 2012-01-19: 16:00:00 via INTRAVENOUS
  Filled 2012-01-19: qty 2.5

## 2012-01-19 MED ORDER — SODIUM CHLORIDE 0.9 % IJ SOLN
3.0000 mL | Freq: Two times a day (BID) | INTRAMUSCULAR | Status: DC
  Administered 2012-01-20 – 2012-01-21 (×3): 3 mL via INTRAVENOUS

## 2012-01-19 MED ORDER — FENTANYL CITRATE 0.05 MG/ML IJ SOLN
INTRAMUSCULAR | Status: AC
  Filled 2012-01-19: qty 2

## 2012-01-19 MED ORDER — PHENYLEPHRINE 40 MCG/ML (10ML) SYRINGE FOR IV PUSH (FOR BLOOD PRESSURE SUPPORT)
PREFILLED_SYRINGE | INTRAVENOUS | Status: AC
  Filled 2012-01-19: qty 5

## 2012-01-19 MED ORDER — MIDAZOLAM HCL 2 MG/2ML IJ SOLN: 0.5000 mg | Freq: Once | INTRAMUSCULAR | Status: DC | PRN

## 2012-01-19 MED ORDER — MORPHINE SULFATE 0.5 MG/ML IJ SOLN
INTRAMUSCULAR | Status: AC
  Filled 2012-01-19: qty 10

## 2012-01-19 MED ORDER — LACTATED RINGERS IV SOLN
INTRAVENOUS | Status: DC
  Administered 2012-01-19 – 2012-01-20 (×4): via INTRAVENOUS

## 2012-01-19 MED ORDER — GENTAMICIN SULFATE 40 MG/ML IJ SOLN
Freq: Three times a day (TID) | INTRAVENOUS | Status: AC
  Administered 2012-01-19 – 2012-01-20 (×2): via INTRAVENOUS
  Filled 2012-01-19 (×2): qty 2.5

## 2012-01-19 MED ORDER — PRENATAL MULTIVITAMIN CH
1.0000 | ORAL_TABLET | Freq: Every day | ORAL | Status: DC
  Administered 2012-01-20 – 2012-01-22 (×3): 1 via ORAL
  Filled 2012-01-19 (×3): qty 1

## 2012-01-19 MED ORDER — ACETAMINOPHEN 325 MG PO TABS: 325.0000 mg | ORAL_TABLET | ORAL | Status: DC | PRN

## 2012-01-19 MED ORDER — SIMETHICONE 80 MG PO CHEW
80.0000 mg | CHEWABLE_TABLET | Freq: Three times a day (TID) | ORAL | Status: DC
  Administered 2012-01-19 – 2012-01-22 (×10): 80 mg via ORAL

## 2012-01-19 MED ORDER — DIPHENHYDRAMINE HCL 25 MG PO CAPS: 25.0000 mg | ORAL_CAPSULE | Freq: Four times a day (QID) | ORAL | Status: DC | PRN

## 2012-01-19 MED ORDER — MORPHINE SULFATE (PF) 0.5 MG/ML IJ SOLN
INTRAMUSCULAR | Status: DC | PRN
  Administered 2012-01-19: .1 mg via INTRATHECAL

## 2012-01-19 MED ORDER — METHYLERGONOVINE MALEATE 0.2 MG PO TABS: 0.2000 mg | ORAL_TABLET | ORAL | Status: DC | PRN

## 2012-01-19 MED ORDER — ZOLPIDEM TARTRATE 5 MG PO TABS: 5.0000 mg | ORAL_TABLET | Freq: Every evening | ORAL | Status: DC | PRN

## 2012-01-19 MED ORDER — DIPHENHYDRAMINE HCL 25 MG PO CAPS: 25.0000 mg | ORAL_CAPSULE | ORAL | Status: DC | PRN

## 2012-01-19 MED ORDER — DIPHENHYDRAMINE HCL 50 MG/ML IJ SOLN: 12.5000 mg | INTRAMUSCULAR | Status: DC | PRN

## 2012-01-19 MED ORDER — CITRIC ACID-SODIUM CITRATE 334-500 MG/5ML PO SOLN
30.0000 mL | Freq: Once | ORAL | Status: AC
  Administered 2012-01-19: 30 mL via ORAL
  Filled 2012-01-19: qty 15

## 2012-01-19 MED ORDER — KETOROLAC TROMETHAMINE 30 MG/ML IJ SOLN
INTRAMUSCULAR | Status: AC
  Administered 2012-01-19: 30 mg via INTRAVENOUS
  Filled 2012-01-19: qty 1

## 2012-01-19 MED ORDER — BUPIVACAINE HCL (PF) 0.25 % IJ SOLN
INTRAMUSCULAR | Status: AC
  Filled 2012-01-19: qty 30

## 2012-01-19 MED ORDER — FENTANYL CITRATE 0.05 MG/ML IJ SOLN
INTRAMUSCULAR | Status: DC | PRN
  Administered 2012-01-19: 25 ug via INTRATHECAL

## 2012-01-19 MED ORDER — OXYCODONE-ACETAMINOPHEN 5-325 MG PO TABS
1.0000 | ORAL_TABLET | ORAL | Status: DC | PRN
  Administered 2012-01-20 – 2012-01-21 (×4): 1 via ORAL
  Administered 2012-01-21: 2 via ORAL
  Administered 2012-01-21 – 2012-01-22 (×4): 1 via ORAL
  Filled 2012-01-19 (×4): qty 1
  Filled 2012-01-19: qty 2
  Filled 2012-01-19 (×4): qty 1

## 2012-01-19 MED ORDER — NALOXONE HCL 0.4 MG/ML IJ SOLN: 0.4000 mg | INTRAMUSCULAR | Status: DC | PRN

## 2012-01-19 MED ORDER — OXYTOCIN 10 UNIT/ML IJ SOLN
INTRAMUSCULAR | Status: AC
  Filled 2012-01-19: qty 2

## 2012-01-19 MED ORDER — OXYTOCIN 10 UNIT/ML IJ SOLN
INTRAMUSCULAR | Status: DC | PRN
  Administered 2012-01-19: 20 [IU] via INTRAMUSCULAR

## 2012-01-19 MED ORDER — KETOROLAC TROMETHAMINE 30 MG/ML IJ SOLN
30.0000 mg | Freq: Four times a day (QID) | INTRAMUSCULAR | Status: DC | PRN
  Administered 2012-01-19: 30 mg via INTRAVENOUS

## 2012-01-19 MED ORDER — SODIUM CHLORIDE 0.9 % IV SOLN: 250.0000 mL | INTRAVENOUS | Status: DC

## 2012-01-19 MED ORDER — FENTANYL CITRATE 0.05 MG/ML IJ SOLN
INTRAMUSCULAR | Status: AC
  Administered 2012-01-19: 50 ug via INTRAVENOUS
  Filled 2012-01-19: qty 2

## 2012-01-19 MED ORDER — ONDANSETRON HCL 4 MG/2ML IJ SOLN
INTRAMUSCULAR | Status: AC
  Filled 2012-01-19: qty 2

## 2012-01-19 MED ORDER — PHENYLEPHRINE 40 MCG/ML (10ML) SYRINGE FOR IV PUSH (FOR BLOOD PRESSURE SUPPORT)
PREFILLED_SYRINGE | INTRAVENOUS | Status: AC
  Filled 2012-01-19: qty 10

## 2012-01-19 MED ORDER — LANOLIN HYDROUS EX OINT: 1.0000 "application " | TOPICAL_OINTMENT | CUTANEOUS | Status: DC | PRN

## 2012-01-19 MED ORDER — IBUPROFEN 600 MG PO TABS
600.0000 mg | ORAL_TABLET | Freq: Four times a day (QID) | ORAL | Status: DC
  Administered 2012-01-20 – 2012-01-22 (×9): 600 mg via ORAL
  Filled 2012-01-19 (×9): qty 1

## 2012-01-19 MED ORDER — WITCH HAZEL-GLYCERIN EX PADS: 1.0000 "application " | MEDICATED_PAD | CUTANEOUS | Status: DC | PRN

## 2012-01-19 MED ORDER — ONDANSETRON HCL 4 MG/2ML IJ SOLN: 4.0000 mg | INTRAMUSCULAR | Status: DC | PRN

## 2012-01-19 MED ORDER — FLEET ENEMA 7-19 GM/118ML RE ENEM: 1.0000 | ENEMA | Freq: Every day | RECTAL | Status: DC | PRN

## 2012-01-19 MED ORDER — PHENYLEPHRINE HCL 10 MG/ML IJ SOLN
INTRAMUSCULAR | Status: DC | PRN
  Administered 2012-01-19 (×7): 80 ug via INTRAVENOUS
  Administered 2012-01-19: 40 ug via INTRAVENOUS
  Administered 2012-01-19 (×3): 80 ug via INTRAVENOUS
  Administered 2012-01-19: 40 ug via INTRAVENOUS
  Administered 2012-01-19: 80 ug via INTRAVENOUS

## 2012-01-19 MED ORDER — ONDANSETRON HCL 4 MG/2ML IJ SOLN: 4.0000 mg | Freq: Three times a day (TID) | INTRAMUSCULAR | Status: DC | PRN

## 2012-01-19 MED ORDER — ONDANSETRON HCL 4 MG PO TABS: 4.0000 mg | ORAL_TABLET | ORAL | Status: DC | PRN

## 2012-01-19 MED ORDER — SIMETHICONE 80 MG PO CHEW: 80.0000 mg | CHEWABLE_TABLET | ORAL | Status: DC | PRN

## 2012-01-19 MED ORDER — OXYTOCIN 20 UNITS IN LACTATED RINGERS INFUSION - SIMPLE
125.0000 mL/h | INTRAVENOUS | Status: AC
  Administered 2012-01-19: 125 mL/h via INTRAVENOUS
  Filled 2012-01-19: qty 1000

## 2012-01-19 MED ORDER — TETANUS-DIPHTH-ACELL PERTUSSIS 5-2.5-18.5 LF-MCG/0.5 IM SUSP
0.5000 mL | Freq: Once | INTRAMUSCULAR | Status: AC
  Administered 2012-01-20: 0.5 mL via INTRAMUSCULAR
  Filled 2012-01-19: qty 0.5

## 2012-01-19 MED ORDER — NALBUPHINE HCL 10 MG/ML IJ SOLN: 5.0000 mg | INTRAMUSCULAR | Status: DC | PRN

## 2012-01-19 MED ORDER — SODIUM CHLORIDE 0.9 % IV SOLN: 1.0000 ug/kg/h | INTRAVENOUS | Status: DC | PRN

## 2012-01-19 MED ORDER — NALBUPHINE HCL 10 MG/ML IJ SOLN
5.0000 mg | INTRAMUSCULAR | Status: DC | PRN
Start: 2012-01-19 — End: 2012-01-22

## 2012-01-19 MED ORDER — BISACODYL 10 MG RE SUPP: 10.0000 mg | Freq: Every day | RECTAL | Status: DC | PRN

## 2012-01-19 MED ORDER — METOCLOPRAMIDE HCL 5 MG/ML IJ SOLN: 10.0000 mg | Freq: Three times a day (TID) | INTRAMUSCULAR | Status: DC | PRN

## 2012-01-19 MED ORDER — KETOROLAC TROMETHAMINE 30 MG/ML IJ SOLN: 30.0000 mg | Freq: Four times a day (QID) | INTRAMUSCULAR | Status: DC | PRN

## 2012-01-19 MED ORDER — ONDANSETRON HCL 4 MG/2ML IJ SOLN
INTRAMUSCULAR | Status: DC | PRN
  Administered 2012-01-19: 4 mg via INTRAVENOUS

## 2012-01-19 MED ORDER — FENTANYL CITRATE 0.05 MG/ML IJ SOLN
25.0000 ug | INTRAMUSCULAR | Status: DC | PRN
  Administered 2012-01-19: 50 ug via INTRAVENOUS

## 2012-01-19 SURGICAL SUPPLY — 43 items
APL SKNCLS STERI-STRIP NONHPOA (GAUZE/BANDAGES/DRESSINGS)
BARRIER ADHS 3X4 INTERCEED (GAUZE/BANDAGES/DRESSINGS) ×1 IMPLANT
BENZOIN TINCTURE PRP APPL 2/3 (GAUZE/BANDAGES/DRESSINGS) IMPLANT
BRR ADH 4X3 ABS CNTRL BYND (GAUZE/BANDAGES/DRESSINGS) ×1
CHLORAPREP W/TINT 26ML (MISCELLANEOUS) ×2 IMPLANT
CLOTH BEACON ORANGE TIMEOUT ST (SAFETY) ×2 IMPLANT
CONTAINER PREFILL 10% NBF 15ML (MISCELLANEOUS) IMPLANT
DRESSING TELFA 8X3 (GAUZE/BANDAGES/DRESSINGS) ×2 IMPLANT
ELECT REM PT RETURN 9FT ADLT (ELECTROSURGICAL) ×2
ELECTRODE REM PT RTRN 9FT ADLT (ELECTROSURGICAL) ×1 IMPLANT
EXTRACTOR VACUUM KIWI (MISCELLANEOUS) IMPLANT
EXTRACTOR VACUUM M CUP 4 TUBE (SUCTIONS) IMPLANT
GAUZE SPONGE 4X4 12PLY STRL LF (GAUZE/BANDAGES/DRESSINGS) ×4 IMPLANT
GLOVE BIO SURGEON STRL SZ 6.5 (GLOVE) ×2 IMPLANT
GLOVE BIOGEL PI IND STRL 7.0 (GLOVE) ×2 IMPLANT
GLOVE BIOGEL PI INDICATOR 7.0 (GLOVE) ×2
GOWN PREVENTION PLUS LG XLONG (DISPOSABLE) ×6 IMPLANT
KIT ABG SYR 3ML LUER SLIP (SYRINGE) IMPLANT
NDL HYPO 25X1 1.5 SAFETY (NEEDLE) ×1 IMPLANT
NDL HYPO 25X5/8 SAFETYGLIDE (NEEDLE) IMPLANT
NEEDLE HYPO 25X1 1.5 SAFETY (NEEDLE) ×2 IMPLANT
NEEDLE HYPO 25X5/8 SAFETYGLIDE (NEEDLE) IMPLANT
NS IRRIG 1000ML POUR BTL (IV SOLUTION) ×2 IMPLANT
PACK C SECTION WH (CUSTOM PROCEDURE TRAY) ×2 IMPLANT
PAD ABD 7.5X8 STRL (GAUZE/BANDAGES/DRESSINGS) ×2 IMPLANT
RTRCTR C-SECT PINK 25CM LRG (MISCELLANEOUS) IMPLANT
SLEEVE SCD COMPRESS KNEE MED (MISCELLANEOUS) IMPLANT
STAPLER VISISTAT 35W (STAPLE) IMPLANT
STRIP CLOSURE SKIN 1/2X4 (GAUZE/BANDAGES/DRESSINGS) IMPLANT
SUT CHROMIC GUT AB #0 18 (SUTURE) IMPLANT
SUT MNCRL 0 VIOLET CTX 36 (SUTURE) ×3 IMPLANT
SUT MON AB 4-0 PS1 27 (SUTURE) IMPLANT
SUT MONOCRYL 0 CTX 36 (SUTURE) ×3
SUT PLAIN 2 0 (SUTURE)
SUT PLAIN ABS 2-0 CT1 27XMFL (SUTURE) IMPLANT
SUT VIC AB 0 CT1 27 (SUTURE) ×4
SUT VIC AB 0 CT1 27XBRD ANBCTR (SUTURE) ×2 IMPLANT
SUT VIC AB 2-0 CT1 27 (SUTURE) ×2
SUT VIC AB 2-0 CT1 TAPERPNT 27 (SUTURE) ×1 IMPLANT
SYR CONTROL 10ML LL (SYRINGE) ×2 IMPLANT
TOWEL OR 17X24 6PK STRL BLUE (TOWEL DISPOSABLE) ×4 IMPLANT
TRAY FOLEY CATH 14FR (SET/KITS/TRAYS/PACK) IMPLANT
WATER STERILE IRR 1000ML POUR (IV SOLUTION) ×2 IMPLANT

## 2012-01-19 NOTE — Anesthesia Preprocedure Evaluation (Addendum)
Anesthesia Evaluation  Patient identified by MRN, date of birth, ID band Patient awake    Reviewed: Allergy & Precautions, H&P , NPO status , Patient's Chart, lab work & pertinent test results  Airway Mallampati: III      Dental No notable dental hx.    Pulmonary neg pulmonary ROS,  breath sounds clear to auscultation  Pulmonary exam normal       Cardiovascular Exercise Tolerance: Good negative cardio ROS  Rhythm:regular Rate:Normal     Neuro/Psych negative neurological ROS  negative psych ROS   GI/Hepatic negative GI ROS, Neg liver ROS,   Endo/Other  negative endocrine ROSDiabetes mellitus-, GestationalMorbid obesity  Renal/GU negative Renal ROS  negative genitourinary   Musculoskeletal   Abdominal Normal abdominal exam  (+)   Peds  Hematology negative hematology ROS (+)   Anesthesia Other Findings   Reproductive/Obstetrics (+) Pregnancy                           Anesthesia Physical Anesthesia Plan  ASA: III and Emergent  Anesthesia Plan: Spinal   Post-op Pain Management:    Induction:   Airway Management Planned:   Additional Equipment:   Intra-op Plan:   Post-operative Plan:   Informed Consent: I have reviewed the patients History and Physical, chart, labs and discussed the procedure including the risks, benefits and alternatives for the proposed anesthesia with the patient or authorized representative who has indicated his/her understanding and acceptance.     Plan Discussed with: Anesthesiologist, CRNA and Surgeon  Anesthesia Plan Comments:        Anesthesia Quick Evaluation

## 2012-01-19 NOTE — Transfer of Care (Signed)
Immediate Anesthesia Transfer of Care Note  Patient: Anne Calhoun  Procedure(s) Performed: Procedure(s) (LRB): CESAREAN SECTION (N/A)  Patient Location: PACU  Anesthesia Type: Spinal  Level of Consciousness: awake, alert  and oriented  Airway & Oxygen Therapy: Patient Spontanous Breathing and Patient connected to face mask oxygen  Post-op Assessment: Report given to PACU RN and Post -op Vital signs reviewed and stable  Post vital signs: Reviewed and stable  Complications: No apparent anesthesia complications

## 2012-01-19 NOTE — Consult Note (Signed)
Neonatology Note:   Attendance at C-section:    I was asked to attend this repeat C/S at 38 weeks due to [facility] 4/10 today in the office. The mother is a G2P1 O pos, GBS pos with GDM, on glyburide. ROM at delivery, fluid clear. Infant vigorous with good spontaneous cry and tone. Needed only  bulb suctioning. Ap 9/9. Lungs clear to ausc in DR. To CN to care of Pediatrician.   Deatra James, MD

## 2012-01-19 NOTE — Brief Op Note (Signed)
01/19/2012  7:02 PM  PATIENT:  Anne Calhoun  36 y.o. female  PRE-OPERATIVE DIAGNOSIS:  BPP 4/10; Desires Sterilization, Previous Cesarean section, Term gestation, CLASS A2 GDM  POST-OPERATIVE DIAGNOSIS:   BPP 4/01; Desires Sterilization, previous Cesarean section, term gestation, CLASS A2 GDM  PROCEDURE:  Procedure(s) (LRB): REPEAT  CESAREAN SECTION (N/A), KERR HYSTEROTOMY, MODIFIED POMEROY TUBAL LIGATION  SURGEON:  Surgeon(s) and Role:    * Yanci Bachtell A Ceri Mayer, MD - Primary  PHYSICIAN ASSISTANT:   ASSISTANTS: none   ANESTHESIA:   spinal  FINDINGS: LIVE FEMALE W/ CORD AROUND ARM AND NEXT TO CHEST, ANT WALL OMENTAL ADHESIONS, NL TUBS AND OVARIES,  BLADDER ADHESIONS  EBL:   650CC  I/0 2800/100  BLOOD ADMINISTERED:none  DRAINS: none   LOCAL MEDICATIONS USED:  MARCAINE     SPECIMEN:  Source of Specimen:  PORTION OF RIGHT AND LEFT TUBE  DISPOSITION OF SPECIMEN:  PATHOLOGY  COUNTS:  YES  TOURNIQUET:  * No tourniquets in log *  DICTATION: .Other Dictation: Dictation Number [id number]  PLAN OF CARE: Admit to inpatient   PATIENT DISPOSITION:  PACU - hemodynamically stable.   Delay start of Pharmacological VTE agent (>24hrs) due to surgical blood loss or risk of bleeding: no

## 2012-01-19 NOTE — Anesthesia Postprocedure Evaluation (Signed)
  Anesthesia Post-op Note  Patient: Anne Calhoun  Procedure(s) Performed: Procedure(s) (LRB): CESAREAN SECTION (N/A)  Patient Location: PACU  Anesthesia Type: Spinal  Level of Consciousness: awake, alert  and oriented  Airway and Oxygen Therapy: Patient Spontanous Breathing  Post-op Pain: none  Post-op Assessment: Post-op Vital signs reviewed, Patient's Cardiovascular Status Stable, Respiratory Function Stable, Patent Airway, No signs of Nausea or vomiting, Pain level controlled, No headache and No backache  Post-op Vital Signs: Reviewed and stable  Complications: No apparent anesthesia complications

## 2012-01-19 NOTE — MAU Note (Signed)
Pt in Office today had non reactive NST. Sent her to prep for c-section

## 2012-01-19 NOTE — Anesthesia Procedure Notes (Signed)
Spinal  Patient location during procedure: OR Start time: 01/19/2012 5:44 PM Staffing Anesthesiologist: Brayton Caves R Performed by: anesthesiologist  Preanesthetic Checklist Completed: patient identified, site marked, surgical consent, pre-op evaluation, timeout performed, IV checked, risks and benefits discussed and monitors and equipment checked Spinal Block Patient position: sitting Prep: DuraPrep Patient monitoring: heart rate, cardiac monitor, continuous pulse ox and blood pressure Approach: midline Location: L3-4 Injection technique: single-shot Needle Needle type: Sprotte  Needle gauge: 24 G Needle length: 9 cm Assessment Sensory level: T4 Additional Notes Patient identified.  Risk benefits discussed including failed block, incomplete pain control, headache, nerve damage, paralysis, blood pressure changes, nausea, vomiting, reactions to medication both toxic or allergic, and postpartum back pain.  Patient expressed understanding and wished to proceed.  All questions were answered.  Sterile technique used throughout procedure.  CSF was clear.  No parasthesia or other complications.  Please see nursing notes for vital signs.

## 2012-01-20 ENCOUNTER — Inpatient Hospital Stay (HOSPITAL_COMMUNITY): Admission: RE | Admit: 2012-01-20 | Payer: 59 | Source: Ambulatory Visit

## 2012-01-20 ENCOUNTER — Encounter (HOSPITAL_COMMUNITY): Payer: Self-pay | Admitting: *Deleted

## 2012-01-20 ENCOUNTER — Inpatient Hospital Stay (HOSPITAL_COMMUNITY): Payer: 59

## 2012-01-20 LAB — CBC
Hemoglobin: 12.3 g/dL (ref 12.0–15.0)
MCH: 29.9 pg (ref 26.0–34.0)
MCV: 92.7 fL (ref 78.0–100.0)
RBC: 4.12 MIL/uL (ref 3.87–5.11)
WBC: 14.5 10*3/uL — ABNORMAL HIGH (ref 4.0–10.5)

## 2012-01-20 LAB — ABO/RH: ABO/RH(D): O POS

## 2012-01-20 NOTE — Addendum Note (Signed)
Addendum  created 01/20/12 4696 by Renford Dills, CRNA   Modules edited:Notes Section

## 2012-01-20 NOTE — Anesthesia Postprocedure Evaluation (Signed)
  Anesthesia Post-op Note  Patient: Anne Calhoun  Procedure(s) Performed: Procedure(s) (LRB): CESAREAN SECTION (N/A)  Patient Location: Mother/Baby  Anesthesia Type: Spinal  Level of Consciousness: awake  Airway and Oxygen Therapy: Patient Spontanous Breathing  Post-op Pain: mild  Post-op Assessment: Patient's Cardiovascular Status Stable and Respiratory Function Stable  Post-op Vital Signs: stable  Complications: No apparent anesthesia complications

## 2012-01-20 NOTE — Progress Notes (Signed)
Patient ID: Anne Calhoun, female   DOB: 01-Jul-1976, 36 y.o.   MRN: [id number] POD # 1  Subjective: Pt reports feeling tired and short of breath/ Pain controlled with percocet and ibuprofen Tolerating po/ Foley patent and draining/Some nausea after surgery, but no vomiting and no nausea at present.  Tolerating po well Activity: out of bed and ambulate Bleeding is light Newborn info:  Information for the patient's newborn:  Monti, Villers [[id number]]  female Feeding: breast   Objective: VS: Blood pressure 112/74, pulse 73, temperature 97.6 F (36.4 C), temperature source Oral, resp. rate 24, height 5' (1.524 m), weight 100.699 kg (222 lb), last menstrual period 04/26/2011, SpO2 94.00%, unknown if currently breastfeeding.    Intake/Output Summary (Last 24 hours) at 01/20/12 0829 Last data filed at 01/20/12 0600  Gross per 24 hour  Intake   3600 ml  Output   2075 ml  Net   1525 ml      Basename 01/20/12 0340 01/19/12 1249  WBC 14.5* 10.4  HGB 12.3 13.1  HCT 38.2 39.1  PLT 221 245    Blood type: --/--/O POS (05/16 1249) Rubella: Immune (10/15 0000)    Physical Exam:  General: alert, cooperative and mild SOB while talking CV: Regular rate and rhythm Resp: clear Abdomen: soft, NT, hypoactive BS Incision: covered with dsg; C/D/I Uterine Fundus: firm, below umbilicus, nontender Lochia: minimal Ext: edema trace, Homans sign is negative, no sign of DVT and SCD's in place    A/P: POD # 1/ G2P2002 S/P C/Section d/t Elective repeat and BTL Hx GDM Hx pulmonary edema in last pregnancy Will d/c IV; UOP 600cc in 3 to 4 hours O2 Saturation decreased to to 88% overnight; cont O2 at 4L to keep Os sat >92% Continue routine post op orders   Signed: Demetrius Revel, MSN, [facility] 01/20/2012, 8:29 AM

## 2012-01-20 NOTE — Op Note (Signed)
NAME:  Anne Calhoun, Anne Calhoun NO.:  [id number]  MEDICAL RECORD NO.:  [id number]  LOCATION:  9102                          FACILITY:  [facility]  PHYSICIAN:  Maxie Better, M.D.DATE OF BIRTH:  05/18/1976  DATE OF PROCEDURE:  01/19/2012 DATE OF DISCHARGE:                              OPERATIVE REPORT   PREOPERATIVE DIAGNOSES:  Biophysical profile 4/10, class A2 gestational diabetes, previous cesarean section, term gestation, desires sterilization.  POSTOPERATIVE DIAGNOSES:  Class A2 gestational diabetes, biophysical profile 4/10, term gestation, previous cesarean section, desires sterilization.  PROCEDURE:  A repeat cesarean section, Sharl Ma hysterotomy, modified Pomeroy tubal ligation.  ANESTHESIA:  Spinal.  SURGEON:  Maxie Better, M.D.  ASSISTANT:  None.  PROCEDURE:  Under adequate spinal anesthesia, the patient was placed in the supine position with a left lateral tilt.  She was sterilely prepped and draped in usual fashion.  Indwelling Foley catheter was sterilely placed.  0.25% Marcaine was injected along the previous Pfannenstiel skin incision.  Pfannenstiel skin incision was then made, carried down to the rectus fascia.  The rectus fascia was opened transversely.  The rectus fascia was then bluntly and sharply dissected off the lower uterine segment in the inferior fashion.  In the superior aspect, there was dense adhesion of the fascia, at that point, which attempted sharp dissection.  The incidental opening of the parietal peritoneum was accomplished.  Using that opening, the further dissection inferiorly was then performed in order to facilitate superior dissection.  The omentum was noted to be adherent in parts in the anterior abdominal wall, at the upper part of the rectus fascial incision.  This was lysed in addition to the fascia being sharply dissected off superiorly and carried up further in order to facilitate the surgery.  Once this was done,  it was then noted that there was a focal area of the uterus attached to the anterior abdominal wall which was also lysed.  Once the parietal peritoneum was further opened and the rectus fascia superiorly and inferiorly was able to be dissected away carefully, the bladder retractor was then placed.  Bladder adhesions was noted, which were lysed.  The vesicouterine peritoneum was opened transversely.  The bladder was sharply dissected off lower uterine segment, and a curvilinear low transverse uterine incision was then made and extended with bandage scissors.  With the intact membranes, the vertex baby was brought up into the uterine opening with subsequent spontaneous rupture of the fluid which was clear and delivery of a live female with cord around the right hand and draped next to her chest.  The baby was bulb Suctioned on the abdomen and  the cord was clamped and cut.  The baby was transferred to the awaiting pediatrician who assigned Apgars of 9 and 9 at 1 and 5 minutes.  The placenta was spontaneous intact, not sent to Pathology. Uterine cavity was cleaned of debris.  Uterine incision had no extension, it was closed in 2 layers, the first layer 0 Monocryl running locked stitch, second layer was imbricated using 0 Monocryl suture.  The abdomen was then copiously irrigated and suctioned of debris.  The paracolic gutters were cleaned.  The tubes and ovaries were then identified both  of which were normal.  The left fallopian tube was then grasped with a Babcock clamp.  The underlying mesosalpinx was opened with cautery.  The proximal distal portion of the tube was then tied with 0 chromic sutures proximally distally x2 and the intervening segment of tube was then removed.  The same procedure was performed on the contralateral side, and the portion of right fallopian tube was also removed.  Reinspection of the lower uterine segment showed good hemostasis at that point, the further  irrigation was then performed and suctioned debris followed by Interceed being placed overlying the lower uterine segment.  The omentum was inspected.  Areas of separation was also cut in order to reduce any risk of obstruction.  Additionally, adhesions were further lysed of the omentum off of the anterior abdominal wall with subsequent freeness of the omentum.  At that point, the parietal peritoneum was closed with 2-0 Vicryl, the rectus fascia was closed with 0 Vicryl x2.  The subcutaneous area was irrigated with small bleeders cauterized.  Interrupted 2-0 plain sutures placed and the skin approximated using Ethicon staples.  SPECIMEN:  Portion of right and left fallopian tubes sent to Pathology. The placenta was not sent.  ESTIMATED BLOOD LOSS:  650 mL.  URINE OUTPUT:  100 mL.  INTRAOPERATIVE FLUID:  2800 mL.  Sponge and instrument counts x2 was correct.  COMPLICATIONS:  None.  The patient tolerated the procedure well, was transferred to recovery in stable condition.     Maxie Better, M.D.     [place]/MEDQ  D:  01/19/2012  T:  01/20/2012  Job:  [id number]

## 2012-01-20 NOTE — Progress Notes (Signed)
Duplicate EBL amounts documented by L/D nurse, corrected by Erasmo Downer RN

## 2012-01-21 ENCOUNTER — Inpatient Hospital Stay (HOSPITAL_COMMUNITY): Payer: 59

## 2012-01-21 ENCOUNTER — Encounter (HOSPITAL_COMMUNITY): Payer: Self-pay | Admitting: Obstetrics and Gynecology

## 2012-01-21 DIAGNOSIS — R0902 Hypoxemia: Secondary | ICD-10-CM

## 2012-01-21 DIAGNOSIS — J9819 Other pulmonary collapse: Secondary | ICD-10-CM

## 2012-01-21 DIAGNOSIS — J9811 Atelectasis: Secondary | ICD-10-CM

## 2012-01-21 MED ORDER — HYDROCHLOROTHIAZIDE 25 MG PO TABS
25.0000 mg | ORAL_TABLET | Freq: Every day | ORAL | Status: DC
  Administered 2012-01-21 – 2012-01-22 (×2): 25 mg via ORAL
  Filled 2012-01-21 (×2): qty 1

## 2012-01-21 MED ORDER — FUROSEMIDE 10 MG/ML IJ SOLN
40.0000 mg | Freq: Once | INTRAMUSCULAR | Status: AC
  Administered 2012-01-21: 40 mg via INTRAVENOUS
  Filled 2012-01-21: qty 4

## 2012-01-21 NOTE — Progress Notes (Signed)
Subjective: POD# 2 Information for the patient's newborn:  Akeila, Lana Girl Beautifull [[id number]]  female   Reports feeling well, was able to walk around a bit yesterday, feels like breathing a bit improved. Using IS regularly. SOB present when walking more than 50 feet. Feeding: breast Patient reports tolerating PO.  Breast symptoms: none Pain controlled with Motrin and Percocet Denies HA/C/P/N/V/dizziness. Flatus present. She reports vaginal bleeding as normal, without clots.  She is ambulating, urinating without difficult.     Objective:   VS:  Filed Vitals:   01/20/12 1700 01/20/12 2115 01/21/12 0518 01/21/12 0815  BP: 104/62 113/76 117/76   Pulse: 77 89 78   Temp: 98 F (36.7 C) 98.3 F (36.8 C) 97.8 F (36.6 C)   TempSrc: Oral Oral Oral   Resp: 20 18 18    Height:      Weight:      SpO2: 92%   92%     Intake/Output Summary (Last 24 hours) at 01/21/12 0856 Last data filed at 01/20/12 1600  Gross per 24 hour  Intake    823 ml  Output   1500 ml  Net   -677 ml        Basename 01/20/12 0340 01/19/12 1249  WBC 14.5* 10.4  HGB 12.3 13.1  HCT 38.2 39.1  PLT 221 245     Blood type: --/--/O POS (05/16 1249)  Rubella: Immune (10/15 0000)     Physical Exam:  General: alert, cooperative, no distress and morbidly obese CV: Regular rate and rhythm Resp: diminished R side, small crackles audible throughout Abdomen: soft, nontender, normal bowel sounds Incision: clean, dry, intact and staples intact Uterine Fundus: firm, below umbilicus, nontender Lochia: minimal Ext: edema +1 pedal and Homans sign is negative, no sign of DVT      Assessment/Plan: 36 y.o.   POD# 2.  s/p Cesarean Delivery.  Indications: repeat and BTL                Principal Problem:  *PP Rpt C/S/BTL 01/19/12 F   Pulmonary edema, no evidence pneumonia, afebrile, no evidence heart disease    O2  sat 92 w/ O2 2L [place], in 80's w/o O2     Lasix 40 mg now  strict I&O      IS w/ RN at [facility] q 1hr for next  4 hrs  Will rpt CXR this PM if no improvement in O2sat      Ambulate, shower this AM with chair in shower Routine post-op care   Consult Dr. Serita Grammes 01/21/2012, 8:56 AM

## 2012-01-21 NOTE — Consult Note (Signed)
Dictation # [id number]

## 2012-01-21 NOTE — Progress Notes (Signed)
Pt notes feeling much better, no CP, no SOB, able to breathe easily. Pt not concerned about her pulmonary status and thinks "the Lasix worked". Pt states pulmonologist told her to use IS and that he was not recommending any additional treatment.  PE:  Filed Vitals:   01/21/12 1700 01/21/12 2114 01/21/12 2125 01/21/12 2128  BP:   117/80   Pulse:   110   Temp:   97.9 F (36.6 C)   TempSrc:   Oral   Resp:   20   Height:      Weight:      SpO2: 92% 93%  93%   Gen: well appearing, not dyspneic, comfortable sitting in chair CV: tachycardic, no murmur Pulm: distant breath sounds, no crackles, no wheeze  A/P: pulmonary status improving, vitals stable, despite X-ray findings. Will cont to treat clinically. No evidence PNA. Chemical pneumonitis a possibility (desat in OR, possible aspiration per team). Pulmonary edema likely (pt had w/ last pregnancy). If pt does not cont to improve or gets worse will also need to think about cardiomyopathy.  Pulmonology consulted but unable to access note and no provider or nurse contacted about findings/ recommendations.  Tayona Sarnowski A. 01/21/2012 11:51 PM

## 2012-01-22 MED ORDER — HYDROCHLOROTHIAZIDE 25 MG PO TABS: 25.0000 mg | ORAL_TABLET | Freq: Every day | ORAL | Status: DC

## 2012-01-22 MED ORDER — IBUPROFEN 600 MG PO TABS: 600.0000 mg | ORAL_TABLET | Freq: Four times a day (QID) | ORAL | Status: AC

## 2012-01-22 MED ORDER — OXYCODONE-ACETAMINOPHEN 5-325 MG PO TABS: 1.0000 | ORAL_TABLET | ORAL | Status: DC | PRN

## 2012-01-22 MED ORDER — OXYCODONE-ACETAMINOPHEN 5-325 MG PO TABS: 1.0000 | ORAL_TABLET | ORAL | Status: AC | PRN

## 2012-01-22 NOTE — Discharge Instructions (Addendum)
Atelectasis Atelectasis involves a collapse of the small air sacs in the lungs. It is a condition in which all or part of a lung or lungs collapse and become airless. Atelectasis may come on suddenly (acute). Or it may persist long-term (chronic). In acute atelectasis, the lung has recently collapsed. This is mainly significant only for its airlessness. Chronic atelectasis has the airlessness, too. But it also may have infection and scarring (fibrosis). There may also be a widening of the bronchi (bronchiectasis). Bronchiectasis is a disease of the lungs in which the structure of the tubes lining the lungs (bronchi) is distorted. This makes it difficult for bronchial secretions to be expelled. This leads to infections which are difficult to treat. There is often a persistent, productive cough. Smokers have a greater risk of developing all the lung problems. CAUSES  The most common cause of atelectasis is obstruction of a bronchus. The bronchi are the two large branches of the trachea which lead directly to the lungs. Smaller airways (bronchioles) can also become blocked. Obstructions may be caused by mucus plugs, tumors, or inhaled foreign bodies. Outside pressure from tumors, enlarged lymph nodes, or fluid (pleural effusion) may cause the problem. A collapse of a lung when air gets between the lung and the rib cage (pneumothorax) may also lead to atelectasis.  This happens because of the following mechanism:   When an airway becomes blocked, the air in the small air sacs of the lung (alveoli) beyond the blockage is absorbed into the bloodstream. This causes the alveoli to collapse. After a little time, the collapsed lung tissue may fill with blood cells, serum, and mucus. This abnormal collection of material is likely to become infected.   Acute atelectasis is a common problem after surgery. During an operation, portions of the lung may not expand as well as usual. This leads to collapse of the small air  sacs. Injuries can cause a similar collapse. Medications which depress breathing (respiration) may also lead to atelectasis. Anything that decreases the size of breaths taken in can cause atelectasis. This could be pain, a tight bandage around the chest, muscle weakness, nerve problems, or broken ribs.   Any of the causes of acute atelectasis may persist and become chronic. When they become chronic; infection, scarring, and other problems are likely to follow. When they are severe enough, shortness of breath and heart problems may follow.  SYMPTOMS   The loss of working lung tissue leads to shortness of breath. This decreases the blood oxygen level. That makes the heart work harder and beat faster. There may also be cyanosis.  That is a blue color to your nail beds and mucous membranes, such as your lips and mouth.   The severity of symptoms depends on how rapidly this happens and over how much time.   The symptoms are not as bad if the problems come on slowly over a long period of time. This is because your body has time to adapt.   A sudden severe change can result in loss of blood pressure (shock) and even death.  DIAGNOSIS  Your caregiver may suspect atelectasis based on your symptoms and physical findings. A chest X-ray may confirm their suspicions. Blood work and more specialized X-rays are sometimes required.  TREATMENT   The main treatment for sudden atelectasis is correction of the underlying cause. A blockage that cannot be removed by coughing or by suctioning the airways often can be removed by bronchoscopy. Bronchoscopy is like looking into the lung  with a slender telescope. It can be used to take biopsies or to remove a foreign body.   Antibiotics are given for infection. Chronic atelectasis often is treated with antibiotics because infection is almost expected.   Sometimes affected lung parts are surgically removed. This is done if:   Recurring infections become disabling.    Bleeding is significant.   The problems persist despite treatment.   If a tumor is blocking the airway, relieving the obstruction by surgery, radiation therapy, or chemotherapy may prevent the condition from progressing to pneumonia.   Atelectasis due to deficient or ineffective surfactant is directed at treating the low blood oxygen. This is often done with mechanical ventilation. Surfactant drugs are lifesaving for premature babies that are deficient. This therapy is experimental in adults with the acute respiratory distress and reduced surfactant activity.  Possible complications include:  Pneumonia.   Germs in the blood stream (sepsis).   Bronchiectasis.   Fluid accumulating in the lungs.   Lung failure.   This may lead to infection.  Most causes of atelectasis are easily treated and do well. Those caused by a tumor will depend on the ease of treating that tumor. In some cases it is not possible to determine the cause of atelectasis early on. So it is important to follow up with your caregiver. PREVENTION   Stop smoking. This is especially necessary before or following surgery.   Following a surgery:   Breathe deeply.   Cough regularly.   Move around as soon as possible.   Using breathing devices to encourage deep breathing and exercises including changing positions to help drainage of secretions may help prevent atelectasis.   Chest deformities and neurologic conditions causing shallow breathing may benefit from mechanical devices to help breathing. One method is continuous positive airway pressure. These machines deliver oxygen through a nose or face mask. They make sure the airways do not collapse. They do this by keeping a positive pressure in the airway. This stays positive even at the end of a breath. Sometimes additional respiratory support is needed with a mechanical ventilator.  SEEK IMMEDIATE MEDICAL CARE IF:   You develop increasing problems with your  breathing.   You develop a fever over 102 F (38.9 C).   You develop severe chest pain.   You develop severe coughing or cough up any blood.  Document Released: 08/22/2005 Document Revised: 08/11/2011 Document Reviewed: 01/04/2007 [facility] Patient Information 2012 [place], [place].Breast Pumping Tips Pumping your breast milk is a good way to stimulate milk production and have a steady supply of breast milk for your infant. Pumping is most helpful during your infant's growth spurts, when involving dad or a family member, or when you are away. There are several types of pumps available. They can be purchased at a baby or maternity store. You can begin pumping soon after delivery, but some experts believe that you should wait about four weeks to give your infant a bottle. In general, the more you breastfeed or pump, the more milk you will have for your infant. It is also important to take good care of yourself. This will reduce stress and help your body to create a healthy supply of milk. Your caregiver or lactation consultant can give you the information and support you need in your efforts to breastfeed your infant. PUMPING BREAST MILK  Follow the tips below for successful breast pumping. Take care of yourself.  Drink enough water or fluids to keep urine clear or pale yellow. You  may notice a thirsty feeling while breastfeeding. This is because your body needs more water to make breast milk. Keep a large water bottle handy. Make healthy drink choices such as unsweetened fruit juice, milk and water. Limit soda, coffee, and alcohol (wait 2 hours to feed or pump if you have an alcoholic drink.)   Eat a healthy, well-balanced diet rich in fruits, vegetables, and whole grains.   Exercise as recommended by your caregiver.   Get plenty of sleep. Sleep when your infant sleeps. Ask friends and family for help if you need time to nap or rest.   Do not smoke. Smoking can lower your milk supply and harm  your infant. If you need help quitting, ask your caregiver for a program recommendation.   Ask your caregiver about birth control options. Birth control pills may lower your milk supply. You may be advised to use condoms or other forms of birth control.  Relax and pump Stimulating your let-down reflex is the key to successful and effective pumping. This makes the milk in all parts of the breast flow more freely.   It is easier to pump breast milk (and breastfeed) while you are relaxed. Find techniques that work for you. Quiet private spaces, breast massage, soothing heat placed on the breast, music, and pictures or a tape recording of your infant may help you to relax and "let down" your milk. If you have difficulty with your let down, try smelling one of your infant's blankets or an item of clothing he or she has worn while you are pumping.   When pumping, place the special suction cup (flange) directly over the nipple. It may be uncomfortable and cause nipple damage if it is not placed properly or is the wrong size. Applying a small amount of purified or modified lanolin to your nipple and the areola may help increase your comfort level. Also, you can change the speed and suction of many electric pumps to your comfort level. Your caregiver or lactation consultant can help you with this.   If pumping continues to be painful, or you feel you are not getting very much milk when you pump, you may need a different type of pump. A lactation consultant can help you determine if this is the case.   If you are with your infant, feed him or her on demand and try pumping after each feeding. This will boost your production, even if milk does not come out. You may not be able to pump much milk at first, but keep up the routine, and this will change.   If you are working or away from your infant for several hours, try pumping for about 15 minutes every 2 to 3 hours. Pump both breasts at the same time if you can.    If your infant has a formula feeding, make sure you pump your milk around the same time to maintain your supply.   Begin pumping breast milk a few weeks before you return to work. This will help you develop techniques that work for you and will be able to store extra milk.   Find a source of breastfeeding information that works well for you.  TIPS FOR STORING BREAST MILK  Store breast milk in a sealable sterile bag, jar, or container provided with your pumping supplies.   Store milk in small amounts close to what your infant is drinking at each feeding.   Cool pumped milk in a refrigerator or cooler. Pumped milk can  last at the back of the refrigerator for 3 to 8 days.   Place cooled milk at the back of the freezer for up to 3 months.   Thaw the milk in its container or bag in warm water up to 24 hours in advance. Do not use a microwave to thaw or heat milk. Do not refreeze the milk after it has been thawed.   Breast milk is safe to drink when left at room temperature (mid 70s or colder) for 4 to 8 hours. After that, throw it away.   Milk fat can separate and look funny. The color can vary slightly from day to day. This is normal. Always shake the milk before using it to mix the fat with the more watery portion.  SEEK MEDICAL CARE IF:   You are having trouble pumping or feeding your infant.   You are concerned that you are not making enough milk.   You have nipple pain, soreness, or redness.   You have other questions or concerns related to you or your infant.  Document Released: 02/09/2010 Document Revised: 08/11/2011 Document Reviewed: 02/09/2010

## 2012-01-22 NOTE — Consult Note (Signed)
NAME:  Anne Calhoun, Anne Calhoun NO.:  [id number]  MEDICAL RECORD NO.:  [id number]  LOCATION:  9102                          FACILITY:  [facility]  PHYSICIAN:  Barbaraann Share, MD,FCCPDATE OF BIRTH:  August 10, 1976  DATE OF CONSULTATION:  01/21/2012 DATE OF DISCHARGE:                                CONSULTATION   REFERRING PHYSICIAN:  Maxie Better, MD, of OB/GYN  HISTORY OF PRESENT ILLNESS:  The patient is a 36 year old female who I have been asked to see for hypoxemia and dyspnea after C-section on Jan 19, 2012.  The patient states that she has had no past medical history for any pulmonary issues, except for a history of pulmonary edema associated with her previous delivery.  After her C-section, she developed increasing oxygen needs, and a chest x-ray showed significant atelectasis, but no infiltrate.  She denies any significant cough, congestion, or mucus production.  Review of her vital signs in the chart shows no fever.  The patient also has not had any pleuritic chest pain, and has had no worsening lower extremity edema after her delivery.  She was given a dose of IV Lasix this morning, and thinks this may have helped her.  She has been working with incentive spirometry, but not aggressively.  She did have a followup chest x-ray today which shows significant bilateral linear atelectasis and multiple lung sounds. Again, there is nothing to suggest overtly a pulmonary infiltrate.  PAST MEDICAL HISTORY:  Totally unremarkable except for gestational diabetes.  She denies any cardiopulmonary history.  ALLERGIES:  The patient is allergic to AMOXICILLIN and MICONAZOLE.  SOCIAL HISTORY:  The patient does not smoke nor does she use recreational drugs.  FAMILY HISTORY:  Totally noncontributory.  REVIEW OF SYSTEMS:  Negative except for that listed in the History of Present Illness.  PHYSICAL EXAMINATION:  GENERAL:  She is obese female in no acute distress. VITAL SIGNS:   Blood pressure was 121/74, pulse 103, respiratory rate 16, she was afebrile. O2 saturation at 1 L was 92-95%. HEENT:  Pupils equal, round, and reactive to light and accommodation. Extraocular muscles are intact.  Nares are patent without discharge. Oropharynx is clear. NECK:  Supple without JVD or  lymphadenopathy.  No palpable thyromegaly. CHEST:  Reveals very faint basilar crackles, but otherwise excellent air flow and no wheezing. CARDIAC:  Reveals a mildly tachycardic.  Regular rhythm, with no obvious murmur. ABDOMEN:  Protuberant but not overly tender, bowel sounds present. GENITAL, RECTAL, AND BREAST:  Not done and not indicated. LOWER EXTREMITIES:  Show mild edema, no cyanosis. NEUROLOGIC:  She is alert and oriented.  Moves all 4 extremities.  IMPRESSION: 1. Acute hypoxemia post C-section and is most likely due to     significant atelectasis.  Patient has no prior history of pulmonary     disease, denies any symptoms that would suggest thromboembolic     disease, and has no symptoms that would be suggestive of pneumonia.     She also continues to be afebrile.  Her chest x-ray clearly shows     bilateral linear atelectasis and therefore, I have encouraged her     to work aggressively on incentive spirometry.  I have also  encouraged her to get out of bed as much as possible and even walk     in the halls.  SUGGESTIONS: 1. Wean supplemental oxygen as needed and oxygen saturation of 90% or     better is adequate for the patient. 2. Mobilize as much as possible. 3. Push incentive spirometry q.1 h. while awake.  If the patient fails     to improve over time the above recommendations or if new findings     arise that suggest a pulmonary process, please call so that we can     come back and reevaluate.     Barbaraann Share, MD,FCCP     [facility]/MED[facility]  D:  01/21/2012  T:  01/21/2012  Job:  [phone]

## 2012-01-22 NOTE — Progress Notes (Addendum)
Subjective: POD# 3 Information for the patient's newborn:  Anne Calhoun, Anne Calhoun [[id number]]  female   Reports feeling better, ambulated and used IS q hour yesterday.  S/P pulmonology consult (dictation notes unavailable) Feeding: breast Patient reports tolerating PO.  Breast symptoms: none Pain controlled with Motrin and Percocet Denies HA/SOB/C/P/N/V/dizziness. Flatus present. She reports vaginal bleeding as normal, without clots.  She is ambulating, urinating without difficult.     Objective:   VS:  Filed Vitals:   01/21/12 2125 01/21/12 2128 01/22/12 0043 01/22/12 0639  BP: 117/80   91/64  Pulse: 110  81 69  Temp: 97.9 F (36.6 C)   97.5 F (36.4 C)  TempSrc: Oral   Oral  Resp: 20   20  Height:      Weight:      SpO2:  93% 90%      Intake/Output Summary (Last 24 hours) at 01/22/12 0858 Last data filed at 01/22/12 0616  Gross per 24 hour  Intake   1735 ml  Output   3100 ml  Net  -1365 ml        Basename 01/20/12 0340 01/19/12 1249  WBC 14.5* 10.4  HGB 12.3 13.1  HCT 38.2 39.1  PLT 221 245     Blood type: --/--/O POS (05/16 1249)  Rubella: Immune (10/15 0000)     Physical Exam:  General: alert, cooperative, no distress and morbidly obese CV: Regular rate and rhythm Resp: diminished in bases, no more rales / crackles bilaterally Abdomen: soft, nontender, normal bowel sounds Incision: clean, dry, intact and staples in place, moist under panus, advised keep dry Uterine Fundus: firm, below umbilicus, nontender Lochia: minimal Ext: edema trace pedal and Homans sign is negative, no sign of DVT      Assessment/Plan: 36 y.o.   POD# 3.  s/p Cesarean Delivery.  Indications: rerpeat elective and BTL                Principal Problem:  *PP Rpt C/S/BTL 01/19/12 F Active Problems:  Atelectasis  Hypoxia Pulmonary status improved, O2 sat 90-93 on room air. VSS, occasional tachy but otherwise 60-70's Pulmonology consult done, no new orders or recommendations  from them  Continue oral diuretic x 1 week and reassess in one week  Doing well, stable.               Routine post-op care D/C home, for staple removal at [facility] 1 wk postop.  Dr. Cherly Hensen at [facility], POC discussed and agrees.  Anne Calhoun 01/22/2012, 8:58 AM

## 2012-01-22 NOTE — Discharge Summary (Signed)
POSTOPERATIVE DISCHARGE SUMMARY:  Patient ID: Anne Calhoun MRN: [id number] DOB/AGE: 1976-06-16 36 y.o.  Admit date: 01/19/2012 Discharge date:  01/22/2012   Admission Diagnoses:  Term pregnancy for repeat cesarean section and tubal ligation Gestational diabetes class A2 Biophysical profile 4/10  Discharge Diagnoses:   Term Pregnancy-delivered Atelectasis - improving Post-op day 3, stable Desires sterilization  Prenatal history: [id number]   EDC : 01/31/2012, by Last Menstrual Period  Prenatal care at [facility] since [redacted] weeks gestation  Prenatal course complicated by gestational diabetes class A2, morbid obesity, history pulmonary edema with previous cesarean section, leukocytosis  Prenatal Labs: ABO, Rh: O (10/15 0000)  Antibody: Negative (10/15 0000) Rubella: Immune (10/15 0000)  RPR: NON REACTIVE (05/16 1249)  HBsAg: Negative (10/15 0000)  HIV: Non-reactive (10/15 0000)  GBS: Positive (05/03 0000)  1 hr Glucola : abnormal   Medical / Surgical History :  Past medical history:  Past Medical History  Diagnosis Date  . Diabetes mellitus     GESTATIONAL  . PP Rpt C/S/BTL 01/19/12 F 01/20/2012    Past surgical history:  Past Surgical History  Procedure Date  . Cesarean section 11/09/2006  . Cesarean section 01/19/2012    Procedure: CESAREAN SECTION;  Surgeon: Serita Kyle, MD;  Location: [facility];  Service: Gynecology;  Laterality: N/A;  with Bilateral Tubal Ligations    Family History:  Family History  Problem Relation Age of Onset  . Diabetes Mother   . Hypertension Sister   . Breast cancer Maternal Grandmother     Social History:  reports that she has never smoked. She has never used smokeless tobacco. She reports that she does not drink alcohol or use illicit drugs.   Allergies: Amoxicillin and Miconazole nitrate    Current Medications at time of admission:  Prescriptions prior to admission  Medication Sig Dispense Refill    . calcium carbonate (TUMS - DOSED IN MG ELEMENTAL CALCIUM) 500 MG chewable tablet Chew 1 tablet by mouth daily. heartburn      . Prenatal Vit-Fe Fumarate-FA (PRENATAL MULTIVITAMIN) TABS Take 1 tablet by mouth daily.      Marland Kitchen DISCONTD: acetaminophen (TYLENOL) 325 MG tablet Take 650 mg by mouth every 6 (six) hours as needed. pain      . DISCONTD: glyBURIDE (DIABETA) 2.5 MG tablet Take 2.5 mg by mouth daily with breakfast.            Intrapartum Course: n/a  Procedures: Cesarean section delivery of female newborn by Dr Cherly Hensen.  See operative report for further details  Postoperative / postpartum course:  Patient developed shortness of breath and hypoxia, oxygen saturation 80's% on room air first day post-operative. Chest x-ray showed low volumes and scattered atelectasis. Patient was given Lasix IV and started on oral diuretic thereafter and started to slowly improve clinically over the course of post-op day 2 and 3. Repeat chest x-ray on post-op day 2 showed worsening atelectasis but patient's clinical status improved overall and she was able to be weaned off oxygen supplementation. Pulmonary consult was obtained on post-op day 2 (consult notes unavailable at this time).  Rigorous incentive spirometer use and ambulation were helpful. Patient was discharged home on post-op day 3 in stable condition.   Physical Exam:  VSS: Blood pressure 91/64, pulse 69, temperature 97.5 F (36.4 C), temperature source Oral, resp. rate 20, height 5' (1.524 m), weight 100.699 kg (222 lb), last menstrual period 04/26/2011, SpO2 90.00%, currently breastfeeding.   LABS:  Basename 01/20/12 0340  01/19/12 1249  WBC 14.5* 10.4  HGB 12.3 13.1  HCT 38.2 39.1  PLT 221 245    I&O: I/O last 3 completed shifts: In: 1735 [P.O.:1735] Out: 3100 [Urine:3100]      Incision:  approximated with staples / no erythema / no ecchymosis / no drainage Staples: for removal in office 1 week post-op  Discharge  Instructions:  Discharged Condition: good Activity: pelvic rest and weight lifting  and driving precautions x 2 weeks Diet: routine Medications:  Medication List  As of 01/22/2012  9:15 AM   STOP taking these medications         acetaminophen 325 MG tablet      glyBURIDE 2.5 MG tablet         TAKE these medications         calcium carbonate 500 MG chewable tablet   Commonly known as: TUMS - dosed in mg elemental calcium   Chew 1 tablet by mouth daily. heartburn      hydrochlorothiazide 25 MG tablet   Commonly known as: HYDRODIURIL   Take 1 tablet (25 mg total) by mouth daily.      ibuprofen 600 MG tablet   Commonly known as: ADVIL,MOTRIN   Take 1 tablet (600 mg total) by mouth every 6 (six) hours.      oxyCODONE-acetaminophen 5-325 MG per tablet   Commonly known as: PERCOCET   Take 1-2 tablets by mouth every 3 (three) hours as needed (moderate - severe pain).      prenatal multivitamin Tabs   Take 1 tablet by mouth daily.           Condition: stable Postpartum Instructions: refer to practice specific booklet Discharge to: home Disposition:  Follow up :  Follow-up Information    Follow up with Kiva Norland A, MD. Schedule an appointment as soon as possible for a visit in 6 weeks.   Contact information:   [address] [place] [address] [phone]       Follow up with [facility]. Schedule an appointment as soon as possible for a visit in 4 days. (for staple removal)           Signed: PAUL,DANIELA 01/22/2012, 9:15 AM

## 2012-01-24 ENCOUNTER — Inpatient Hospital Stay (HOSPITAL_COMMUNITY): Admission: RE | Admit: 2012-01-24 | Payer: 59 | Source: Ambulatory Visit | Admitting: Obstetrics and Gynecology

## 2012-01-24 ENCOUNTER — Encounter (HOSPITAL_COMMUNITY): Admission: RE | Payer: Self-pay | Source: Ambulatory Visit

## 2012-01-24 SURGERY — Surgical Case
Anesthesia: Spinal | Laterality: Bilateral

## 2012-10-11 ENCOUNTER — Ambulatory Visit (INDEPENDENT_AMBULATORY_CARE_PROVIDER_SITE_OTHER): Payer: Managed Care, Other (non HMO) | Admitting: Gynecology

## 2012-10-11 ENCOUNTER — Encounter: Payer: Self-pay | Admitting: Gynecology

## 2012-10-11 VITALS — BP 128/86 | Ht 59.0 in | Wt 192.0 lb

## 2012-10-11 DIAGNOSIS — R634 Abnormal weight loss: Secondary | ICD-10-CM

## 2012-10-11 DIAGNOSIS — L259 Unspecified contact dermatitis, unspecified cause: Secondary | ICD-10-CM

## 2012-10-11 LAB — LIPID PANEL
Cholesterol: 170 mg/dL (ref 0–200)
LDL Cholesterol: 111 mg/dL — ABNORMAL HIGH (ref 0–99)
Triglycerides: 65 mg/dL (ref <150)

## 2012-10-11 MED ORDER — DOXYCYCLINE HYCLATE 100 MG PO CAPS: ORAL_CAPSULE | ORAL | Status: DC

## 2012-10-11 MED ORDER — FLUCONAZOLE 100 MG PO TABS: ORAL_TABLET | ORAL | Status: DC

## 2012-10-11 NOTE — Patient Instructions (Addendum)
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
## 2013-01-05 ENCOUNTER — Other Ambulatory Visit: Payer: Self-pay | Admitting: Gynecology

## 2013-03-01 ENCOUNTER — Encounter: Payer: Self-pay | Admitting: Gynecology

## 2013-07-09 ENCOUNTER — Encounter: Payer: Self-pay | Admitting: Gynecology

## 2013-07-22 ENCOUNTER — Other Ambulatory Visit: Payer: Self-pay | Admitting: Gynecology

## 2013-07-22 ENCOUNTER — Encounter: Payer: Self-pay | Admitting: Gynecology

## 2013-07-24 ENCOUNTER — Ambulatory Visit (INDEPENDENT_AMBULATORY_CARE_PROVIDER_SITE_OTHER): Payer: Managed Care, Other (non HMO) | Admitting: Women's Health

## 2013-07-24 ENCOUNTER — Encounter: Payer: Self-pay | Admitting: Women's Health

## 2013-07-24 ENCOUNTER — Other Ambulatory Visit (HOSPITAL_COMMUNITY)
Admission: RE | Admit: 2013-07-24 | Discharge: 2013-07-24 | Disposition: A | Discharge status: Home / Self Care | Payer: Managed Care, Other (non HMO) | Source: Ambulatory Visit | Attending: Gynecology | Admitting: Gynecology

## 2013-07-24 VITALS — BP 118/74 | Ht 60.0 in | Wt 188.8 lb

## 2013-07-24 DIAGNOSIS — Z833 Family history of diabetes mellitus: Secondary | ICD-10-CM

## 2013-07-24 DIAGNOSIS — N899 Noninflammatory disorder of vagina, unspecified: Secondary | ICD-10-CM

## 2013-07-24 DIAGNOSIS — E079 Disorder of thyroid, unspecified: Secondary | ICD-10-CM

## 2013-07-24 DIAGNOSIS — Z01419 Encounter for gynecological examination (general) (routine) without abnormal findings: Secondary | ICD-10-CM | POA: Insufficient documentation

## 2013-07-24 DIAGNOSIS — N898 Other specified noninflammatory disorders of vagina: Secondary | ICD-10-CM

## 2013-07-24 DIAGNOSIS — Z13220 Encounter for screening for lipoid disorders: Secondary | ICD-10-CM

## 2013-07-24 DIAGNOSIS — Z23 Encounter for immunization: Secondary | ICD-10-CM

## 2013-07-24 LAB — WET PREP FOR TRICH, YEAST, CLUE
Clue Cells Wet Prep HPF POC: NONE SEEN
Trich, Wet Prep: NONE SEEN

## 2013-07-24 LAB — CBC WITH DIFFERENTIAL/PLATELET
Eosinophils Absolute: 0.1 10*3/uL (ref 0.0–0.7)
Lymphs Abs: 2 10*3/uL (ref 0.7–4.0)
MCH: 30.3 pg (ref 26.0–34.0)
Neutrophils Relative %: 73 % (ref 43–77)
Platelets: 381 10*3/uL (ref 150–400)
RBC: 4.29 MIL/uL (ref 3.87–5.11)
WBC: 9.4 10*3/uL (ref 4.0–10.5)

## 2013-07-24 LAB — CHOLESTEROL, TOTAL: Cholesterol: 142 mg/dL (ref 0–200)

## 2013-07-24 LAB — TSH: TSH: 0.974 u[IU]/mL (ref 0.350–4.500)

## 2013-07-24 LAB — GLUCOSE, RANDOM: Glucose, Bld: 92 mg/dL (ref 70–99)

## 2013-07-24 MED ORDER — FLUCONAZOLE 150 MG PO TABS: 150.0000 mg | ORAL_TABLET | Freq: Once | ORAL | Status: DC

## 2013-07-24 NOTE — Patient Instructions (Signed)

## 2013-07-24 NOTE — Progress Notes (Signed)
Anne Calhoun 11-04-1975 [id number]    History:    The patient presents for annual exam.  Monthly cycles/BTL. History of GDM. Normal Pap history.   Past medical history, past surgical history, family history and social history were all reviewed and documented in the EPIC chart. Desk job. Kennedy 6, [address] both doing well. Sister died of a brain aneurysm.  ROS:  A  ROS was performed and pertinent positives and negatives are included in the history.  Exam:  Filed Vitals:   07/24/13 0840  BP: 118/74    General appearance:  Normal Head/Neck:  Normal, without cervical or supraclavicular adenopathy. Thyroid:  Symmetrical, normal in size, without palpable masses or nodularity. Respiratory  Effort:  Normal  Auscultation:  Clear without wheezing or rhonchi Cardiovascular  Auscultation:  Regular rate, without rubs, murmurs or gallops  Edema/varicosities:  Not grossly evident Abdominal  Soft,nontender, without masses, guarding or rebound.  Liver/spleen:  No organomegaly noted  Hernia:  None appreciated  Skin  Inspection:  Grossly normal  Palpation:  Grossly normal Neurologic/psychiatric  Orientation:  Normal with appropriate conversation.  Mood/affect:  Normal  Genitourinary    Breasts: Examined lying and sitting.     Right: Without masses, retractions, discharge or axillary adenopathy.     Left: Without masses, retractions, discharge or axillary adenopathy.   Inguinal/mons:  Normal without inguinal adenopathy  External genitalia:  Normal  BUS/Urethra/Skene's glands:  Normal  Bladder:  Normal  Vagina:  Thymic, wet prep positive for yeast  Cervix:  Normal  Uterus:   normal in size, shape and contour.  Midline and mobile  Adnexa/parametria:     Rt: Without masses or tenderness.   Lt: Without masses or tenderness.  Anus and perineum: Normal  Digital rectal exam: Normal sphincter tone without palpated masses or tenderness  Assessment/Plan:  37 y.o. MBF G2P2 for  annual exam with complaint of vaginal irritation.  Yeast vaginitis BTL Obesity  Plan: CBC, glucose, TSH, UA, Pap. Pap normal 02/2012.  SBE's, continue to increase regular exercise and decrease calories for continued weight loss. MVI daily, calcium rich diet encouraged. Diflucan 150 times one dose with refill given, reports rare yeast infections. Instructed to call if no relief.   Harrington Challenger [facility], 9:58 AM 07/24/2013

## 2013-07-25 LAB — URINALYSIS W MICROSCOPIC + REFLEX CULTURE
Crystals: NONE SEEN
Ketones, ur: NEGATIVE mg/dL
Nitrite: NEGATIVE
Specific Gravity, Urine: 1.026 (ref 1.005–1.030)
Urobilinogen, UA: 0.2 mg/dL (ref 0.0–1.0)

## 2013-07-28 IMAGING — CR DG CHEST 2V
2 series · 2 of 2 positions shown · non-contrast
Comparison: 11/08/2006

CLINICAL DATA: C-section.  Low oxygen saturation.

CHEST - 2 VIEW

[view not recorded (1 of 2)]
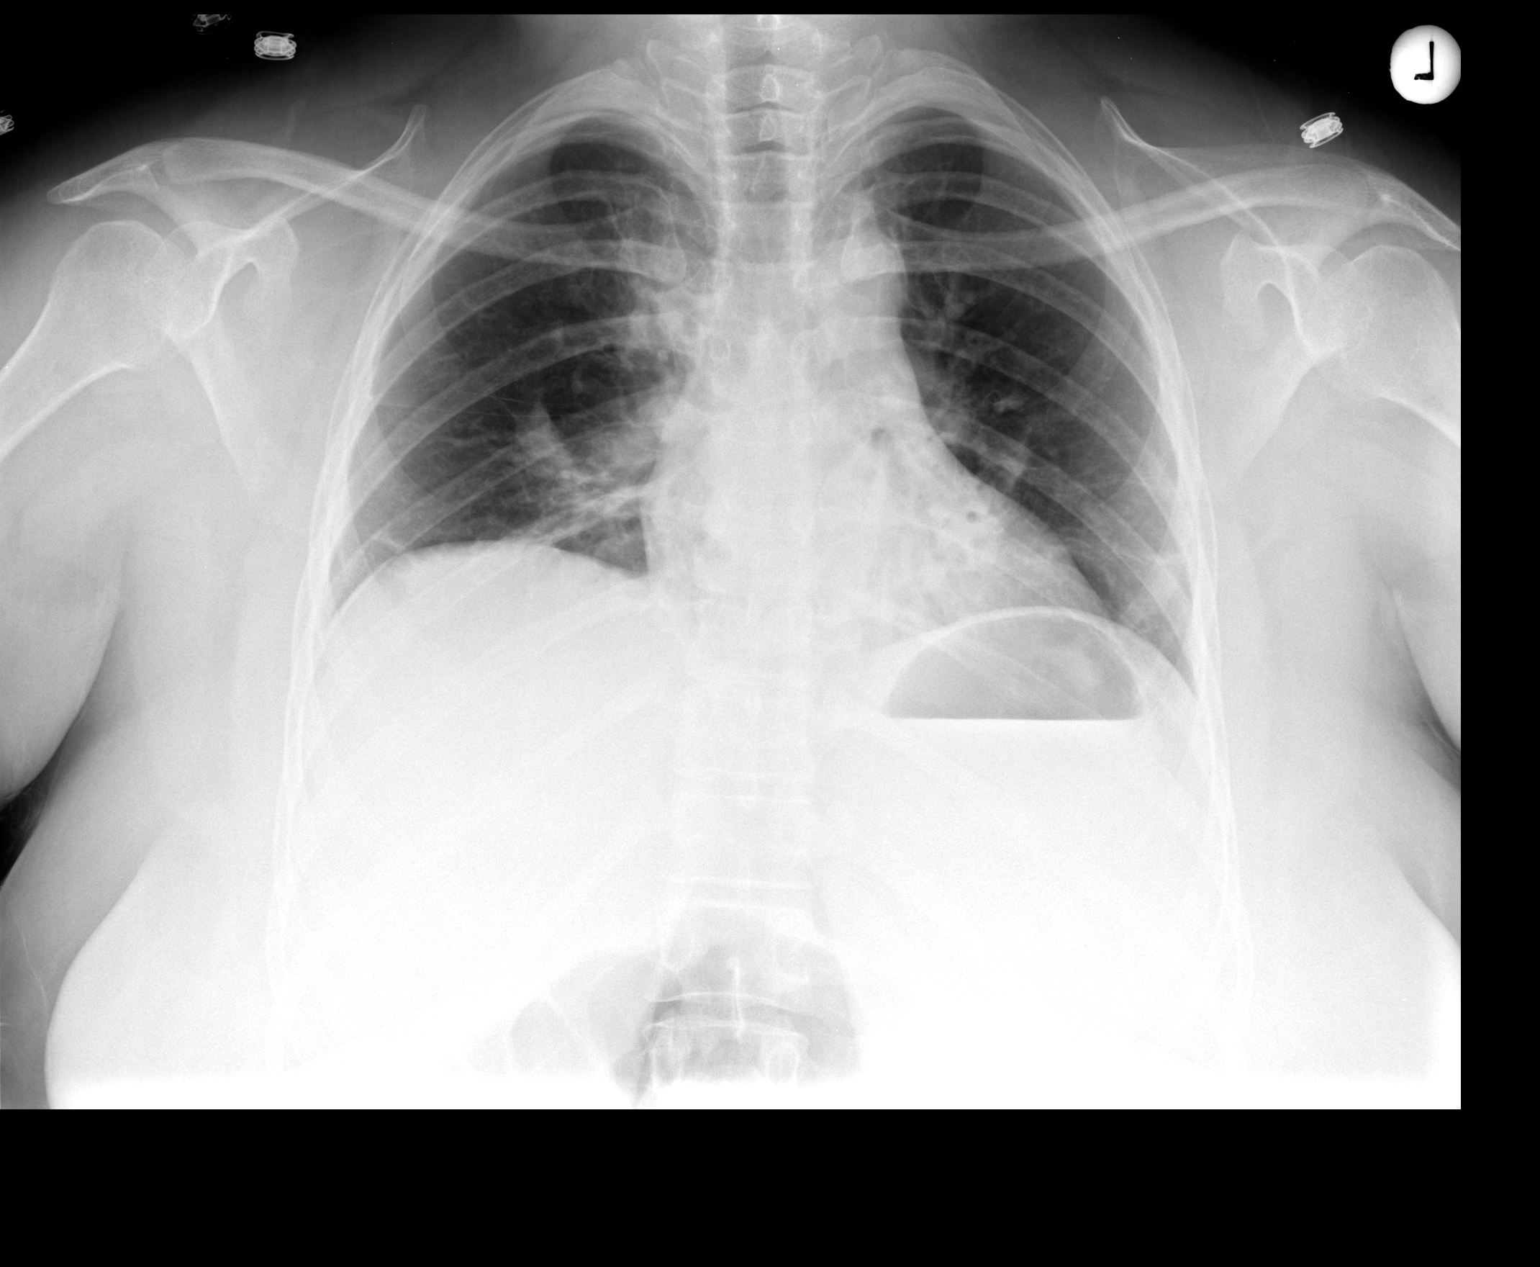

[view not recorded (2 of 2)]
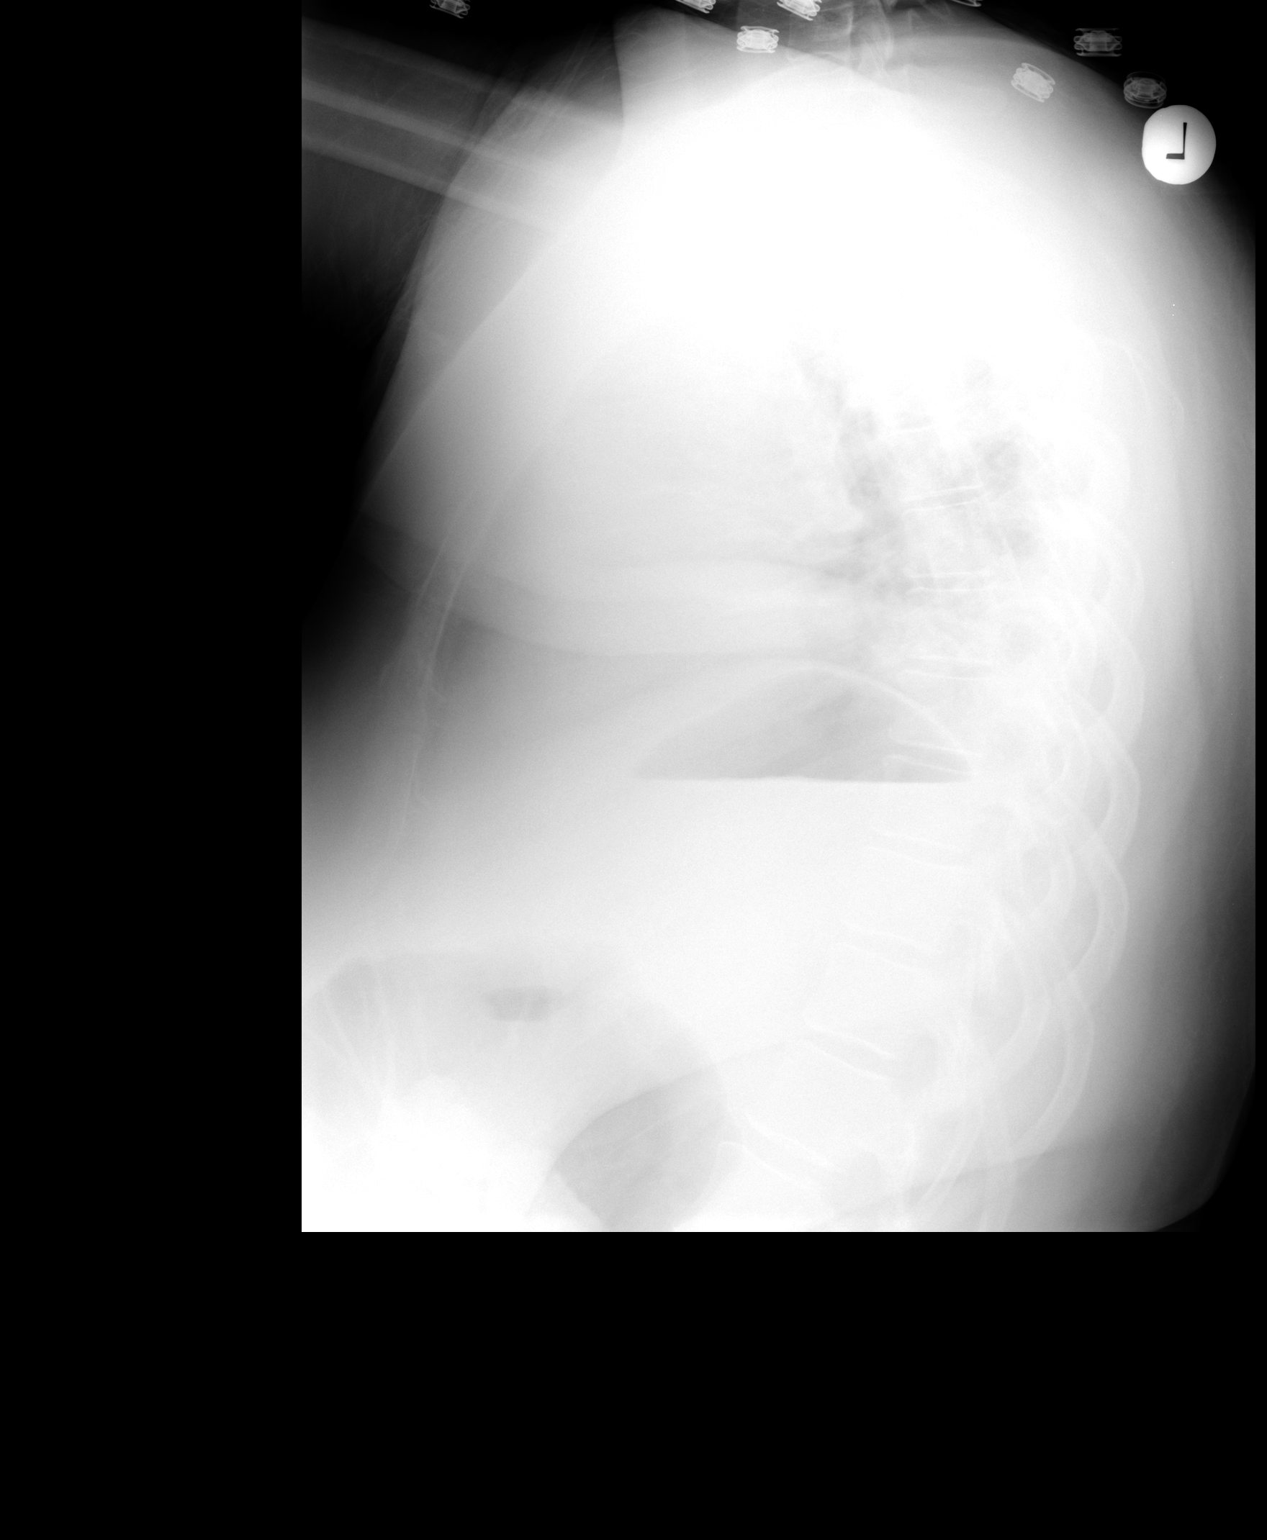

[2 of 2 positions shown; findings below may reference images not displayed]

FINDINGS: Low lung volumes.  Bilateral central basilar scattered
atelectasis.  No pneumothorax and no pleural effusion.  Normal
vascularity.  Heart is normal in size.
IMPRESSION: Low volumes and scattered atelectasis.

## 2013-08-06 ENCOUNTER — Encounter: Payer: Self-pay | Admitting: Gynecology

## 2014-04-28 ENCOUNTER — Encounter: Payer: Self-pay | Admitting: Gynecology

## 2014-04-28 ENCOUNTER — Ambulatory Visit (INDEPENDENT_AMBULATORY_CARE_PROVIDER_SITE_OTHER): Payer: Managed Care, Other (non HMO) | Admitting: Gynecology

## 2014-04-28 VITALS — BP 124/78

## 2014-04-28 DIAGNOSIS — N899 Noninflammatory disorder of vagina, unspecified: Secondary | ICD-10-CM

## 2014-04-28 DIAGNOSIS — N898 Other specified noninflammatory disorders of vagina: Secondary | ICD-10-CM

## 2014-04-28 DIAGNOSIS — N3001 Acute cystitis with hematuria: Secondary | ICD-10-CM

## 2014-04-28 DIAGNOSIS — N3000 Acute cystitis without hematuria: Secondary | ICD-10-CM

## 2014-04-28 LAB — URINALYSIS W MICROSCOPIC + REFLEX CULTURE
BILIRUBIN URINE: NEGATIVE
CASTS: NONE SEEN
CRYSTALS: NONE SEEN
GLUCOSE, UA: NEGATIVE mg/dL
KETONES UR: NEGATIVE mg/dL
Nitrite: POSITIVE — AB
PH: 6 (ref 5.0–8.0)
Protein, ur: 30 mg/dL — AB
Urobilinogen, UA: 0.2 mg/dL (ref 0.0–1.0)

## 2014-04-28 MED ORDER — PHENAZOPYRIDINE HCL 200 MG PO TABS: 200.0000 mg | ORAL_TABLET | Freq: Three times a day (TID) | ORAL | Status: DC | PRN

## 2014-04-28 MED ORDER — CIPROFLOXACIN HCL 250 MG PO TABS: 250.0000 mg | ORAL_TABLET | Freq: Two times a day (BID) | ORAL | Status: DC

## 2014-04-28 MED ORDER — FLUCONAZOLE 150 MG PO TABS: 150.0000 mg | ORAL_TABLET | Freq: Once | ORAL | Status: DC

## 2014-04-28 NOTE — Addendum Note (Signed)
Addended by: Berna Spare A on: 04/28/2014 04:56 PM   Modules accepted: Orders

## 2014-04-28 NOTE — Patient Instructions (Signed)

## 2014-04-28 NOTE — Progress Notes (Signed)
.    The patient presented to the office today stating that since this morning she was been complaining of urinary frequency, and dysuria. She denied any back pain, fever, chills, nausea or vomiting. Patient with previous tubal sterilization procedure. Exam: Back with no evidence of CVA tenderness Abdomen soft nontender no rebound or guarding Pelvic exam not done  Urinalysis: 2 numerous to count WBC, 25-50 rbc, and many bacteria  Assessment/plan: Clinical evidence of urinary tract infection. Patient will be started on Cipro 250 mg twice a day for 3 days. Her bladder spasm she is going to be started on Pyridium 200 mg 1 by mouth 3 times a day for 3 days.

## 2014-04-30 LAB — URINE CULTURE

## 2014-06-20 ENCOUNTER — Other Ambulatory Visit: Payer: Self-pay

## 2014-07-07 ENCOUNTER — Encounter: Payer: Self-pay | Admitting: Gynecology

## 2014-07-25 ENCOUNTER — Ambulatory Visit (INDEPENDENT_AMBULATORY_CARE_PROVIDER_SITE_OTHER): Payer: Managed Care, Other (non HMO) | Admitting: Women's Health

## 2014-07-25 ENCOUNTER — Encounter: Payer: Self-pay | Admitting: Women's Health

## 2014-07-25 VITALS — BP 115/80 | Ht 61.0 in | Wt 171.0 lb

## 2014-07-25 DIAGNOSIS — Z13220 Encounter for screening for lipoid disorders: Secondary | ICD-10-CM

## 2014-07-25 DIAGNOSIS — Z23 Encounter for immunization: Secondary | ICD-10-CM

## 2014-07-25 DIAGNOSIS — Z01419 Encounter for gynecological examination (general) (routine) without abnormal findings: Secondary | ICD-10-CM

## 2014-07-25 LAB — CBC WITH DIFFERENTIAL/PLATELET
BASOS ABS: 0 10*3/uL (ref 0.0–0.1)
Basophils Relative: 0 % (ref 0–1)
EOS ABS: 0.1 10*3/uL (ref 0.0–0.7)
EOS PCT: 1 % (ref 0–5)
HCT: 40.4 % (ref 36.0–46.0)
Hemoglobin: 12.8 g/dL (ref 12.0–15.0)
LYMPHS PCT: 16 % (ref 12–46)
Lymphs Abs: 2 10*3/uL (ref 0.7–4.0)
MCH: 30.3 pg (ref 26.0–34.0)
MCHC: 31.7 g/dL (ref 30.0–36.0)
MCV: 95.7 fL (ref 78.0–100.0)
MPV: 10.2 fL (ref 9.4–12.4)
Monocytes Absolute: 0.4 10*3/uL (ref 0.1–1.0)
Monocytes Relative: 3 % (ref 3–12)
NEUTROS PCT: 80 % — AB (ref 43–77)
Neutro Abs: 10.2 10*3/uL — ABNORMAL HIGH (ref 1.7–7.7)
PLATELETS: 371 10*3/uL (ref 150–400)
RBC: 4.22 MIL/uL (ref 3.87–5.11)
RDW: 13 % (ref 11.5–15.5)
WBC: 12.8 10*3/uL — ABNORMAL HIGH (ref 4.0–10.5)

## 2014-07-25 LAB — URINALYSIS W MICROSCOPIC + REFLEX CULTURE
BACTERIA UA: NONE SEEN
BILIRUBIN URINE: NEGATIVE
Casts: NONE SEEN
Crystals: NONE SEEN
GLUCOSE, UA: NEGATIVE mg/dL
HGB URINE DIPSTICK: NEGATIVE
KETONES UR: NEGATIVE mg/dL
Leukocytes, UA: NEGATIVE
Nitrite: NEGATIVE
PROTEIN: NEGATIVE mg/dL
Specific Gravity, Urine: 1.027 (ref 1.005–1.030)
Urobilinogen, UA: 0.2 mg/dL (ref 0.0–1.0)
pH: 6 (ref 5.0–8.0)

## 2014-07-25 LAB — GLUCOSE, RANDOM: Glucose, Bld: 86 mg/dL (ref 70–99)

## 2014-07-25 LAB — LIPID PANEL
Cholesterol: 145 mg/dL (ref 0–200)
HDL: 50 mg/dL (ref >39)
LDL CALC: 84 mg/dL (ref 0–99)
Total CHOL/HDL Ratio: 2.9 Ratio
Triglycerides: 55 mg/dL (ref <150)
VLDL: 11 mg/dL (ref 0–40)

## 2014-07-25 NOTE — Progress Notes (Signed)
Anne BoozerGretina L Calhoun [birth year] [id number]    History:    Presents for annual exam.  Regular monthly cycle/BTL. Normal Pap history. Has lost 18 pounds in the past year with mostly diet and some exercise.  Past medical history, past surgical history, family history and social history were all reviewed and documented in the EPIC chart. Works with employment services helping people get jobs. Kennedy 7, calcium 2-1/2 both doing well. GDM.  ROS:  A  12 Calhoun ROS was performed and pertinent positives and negatives are included.  Exam:  Filed Vitals:   07/25/14 0824  BP: 115/80    General appearance:  Normal Thyroid:  Symmetrical, normal in size, without palpable masses or nodularity. Respiratory  Auscultation:  Clear without wheezing or rhonchi Cardiovascular  Auscultation:  Regular rate, without rubs, murmurs or gallops  Edema/varicosities:  Not grossly evident Abdominal  Soft,nontender, without masses, guarding or rebound.  Liver/spleen:  No organomegaly noted  Hernia:  None appreciated  Skin  Inspection:  Grossly normal   Breasts: Examined lying and sitting/pendulous causes upper back, neck and shoulder discomfort.     Right: Without masses, retractions, discharge or axillary adenopathy.     Left: Without masses, retractions, discharge or axillary adenopathy. Gentitourinary   Inguinal/mons:  Normal without inguinal adenopathy  External genitalia:  Normal  BUS/Urethra/Skene's glands:  Normal  Vagina:  Normal  Cervix:  Normal  Uterus:   normal in size, shape and contour.  Midline and mobile  Adnexa/parametria:     Rt: Without masses or tenderness.   Lt: Without masses or tenderness.  Anus and perineum: Normal  Digital rectal exam: Normal sphincter tone without palpated masses or tenderness  Assessment/Plan:  38 y.o. MBF G2P2 for annual exam.     Pendulous breasts/upper back and shoulder discomfort Monthly cycle/BTL History of GDM  Plan: CBC, glucose, lipid panel, UA, Pap  normal 2014, new screening guidelines reviewed. SBE's, increase regular exercise, continue healthy diet, vitamin D 1000 daily encouraged. Congratulated on weight loss. Flu shot given. Breast reduction reviewed, plastic surgeons.    Harrington ChallengerYOUNG,Anne Calhoun [facility] Center[facility]NP, 9:03 AM 07/25/2014

## 2014-07-25 NOTE — Addendum Note (Signed)
Addended by: Kem ParkinsonBARNES, Anise Harbin on: 07/25/2014 09:32 AM   Modules accepted: Orders, SmartSet

## 2014-07-25 NOTE — Patient Instructions (Signed)

## 2015-04-29 ENCOUNTER — Ambulatory Visit (INDEPENDENT_AMBULATORY_CARE_PROVIDER_SITE_OTHER): Payer: BLUE CROSS/BLUE SHIELD | Admitting: Gynecology

## 2015-04-29 ENCOUNTER — Encounter: Payer: Self-pay | Admitting: Gynecology

## 2015-04-29 VITALS — BP 124/80

## 2015-04-29 DIAGNOSIS — L304 Erythema intertrigo: Secondary | ICD-10-CM

## 2015-04-29 MED ORDER — NYSTATIN-TRIAMCINOLONE 100000-0.1 UNIT/GM-% EX CREA: 1.0000 "application " | TOPICAL_CREAM | Freq: Three times a day (TID) | CUTANEOUS | Status: DC

## 2015-04-29 NOTE — Patient Instructions (Addendum)
Intertrigo Intertrigo is a skin condition that occurs in between folds of skin in places on the body that rub together a lot and do not get much ventilation. It is caused by heat, moisture, friction, sweat retention, and lack of air circulation, which produces red, irritated patches and, sometimes, scaling or drainage. People who have diabetes, who are obese, or who have treatment with antibiotics are at increased risk for intertrigo. The most common sites for intertrigo to occur include:  The groin.  The breasts.  The armpits.  Folds of abdominal skin.  Webbed spaces between the fingers or toes. Intertrigo may be aggravated by:  Sweat.  Feces.  Yeast or bacteria that are present near skin folds.  Urine.  Vaginal discharge. HOME CARE INSTRUCTIONS  The following steps can be taken to reduce friction and keep the affected area cool and dry:  Expose skin folds to the air.  Keep deep skin folds separated with cotton or linen cloth. Avoid tight fitting clothing that could cause chafing.  Wear open-toed shoes or sandals to help reduce moisture between the toes.  Apply absorbent powders to affected areas as directed by your caregiver.  Apply over-the-counter barrier pastes, such as zinc oxide, as directed by your caregiver.  If you develop a fungal infection in the affected area, your caregiver may have you use antifungal creams. SEEK MEDICAL CARE IF:   The rash is not improving after 1 week of treatment.  The rash is getting worse (more red, more swollen, more painful, or spreading).  You have a fever or chills. MAKE SURE YOU:   Understand these instructions.  Will watch your condition.  Will get help right away if you are not doing well or get worse. Document Released: 08/22/2005 Document Revised: 11/14/2011 Document Reviewed: 02/04/2010 [facility] Patient Information 2015 [place], [place]. This information is not intended to replace advice given to you by your health  care provider. Make sure you discuss any questions you have with your health care provider. Nystatin; Triamcinolone cream or ointment What is this medicine? NYSTATIN; TRIAMCINOLONE (nye STAT in; trye am SIN oh lone) is a combination of an antifungal medicine and a steroid. It is used to treat certain kinds of fungal or yeast infections of the skin. This medicine may be used for other purposes; ask your health care provider or pharmacist if you have questions. COMMON BRAND NAME(S): Myco-Triacet-II, Mycogen-II, Mycolog II, Mytrex, N.T.A. What should I tell my health care provider before I take this medicine? They need to know if you have any of these conditions: -large areas of burned or damaged skin -skin wasting or thinning -peripheral vascular disease or poor circulation -an unusual or allergic reaction to nystatin, triamcinolone, other corticosteroids, other medicines, foods, dyes, or preservatives -pregnant or trying to get pregnant -breast-feeding How should I use this medicine? This medicine is for external use only. Do not take by mouth. Follow the directions on the prescription label. Wash your hands before and after use. If treating hand or nail infections, wash hands before use only. Apply a thin layer of this medicine to the affected area and rub in gently. Do not use on healthy skin or over large areas of skin. Do not get this medicine in your eyes. If you do, rinse out with plenty of cool tap water. When applying to the groin area, apply a limited amount and do not use for longer than 2 weeks unless directed to by your doctor or health care professional. Do not cover or  wrap the treated area with an airtight bandage (such as a plastic bandage). Use the full course of treatment prescribed, even if you think the infection is getting better. Use at regular intervals. Do not use your medicine more often than directed. Do not use this medicine for any condition other than the one for which it  was prescribed. Talk to your pediatrician regarding the use of this medicine in children. While this drug may be prescribed for selected conditions, precautions do apply. Children being treated in the diaper area should not wear tight-fitting diapers or plastic pants. Elderly patients are more likely to have damaged skin through aging, and this may increase side effects. This medicine should only be used for brief periods and infrequently in older patients. Overdosage: If you think you have taken too much of this medicine contact a poison control center or emergency room at once. NOTE: This medicine is only for you. Do not share this medicine with others. What if I miss a dose? If you miss a dose, use it as soon as you can. If it is almost time for your next dose, use only that dose. Do not use double or extra doses. What may interact with this medicine? Interactions are not expected. Do not use any other skin products on the affected area without telling your doctor or health care professional. This list may not describe all possible interactions. Give your health care provider a list of all the medicines, herbs, non-prescription drugs, or dietary supplements you use. Also tell them if you smoke, drink alcohol, or use illegal drugs. Some items may interact with your medicine. What should I watch for while using this medicine? Tell your doctor or health care professional if your symptoms do not start to get better within 1 week when treating the groin area or within 2 weeks when treating the feet. . Tell your doctor or health care professional if you develop sores or blisters that do not heal properly. If your skin infection returns after stopping this medicine, contact your doctor or health care professional. If you are using this medicine to treat an infection in the groin area, do not wear underwear that is tight-fitting or made from synthetic fibers such as rayon or nylon. Instead, wear  loose-fitting, cotton underwear. Also dry the area completely after bathing. What side effects may I notice from receiving this medicine? Side effects that you should report to your doctor or health care professional as soon as possible: -burning or itching of the skin -dark red spots on the skin -loss of feeling on skin -painful, red, pus-filled blisters in hair follicles -skin infection -thinning of the skin or sunburn: more likely if applied to the face Side effects that usually do not require medical attention (report to your doctor or health care professional if they continue or are bothersome): -dry or peeling skin -skin irritation This list may not describe all possible side effects. Call your doctor for medical advice about side effects. You may report side effects to FDA at 1-800-FDA-1088. Where should I keep my medicine? Keep out of the reach of children. Store at room temperature between 15 and 30 degrees C (59 and 86 degrees F). Do not freeze. Throw away any unused medicine after the expiration date. NOTE: This sheet is a summary. It may not cover all possible information. If you have questions about this medicine, talk to your doctor, pharmacist, or health care provider.  2015, [employer]. (2008-03-14 17:29:26)

## 2015-04-29 NOTE — Progress Notes (Signed)
   Patient is a 39 year old who presented to the office today complaining of extensive rash underneath both breasts. Patient has pendulous breasts which is contributed to neck discomfort and headaches and shoulder discomfort as well. Patient currently weighs 188 pounds with a BMI of 36.87 kg/m.  Exam: Blood pressure 124/80 Gen. appearance well-developed wondered female frustrated with of vaginal irritation, pruritus and rash underneath both breasts Breast exam: Both breasts were examined sitting supine position breasts for large pendulous with erythematous plaques noted underneath both breasts consistent with intertrigo.  Assessment/plan: Patient with breasts intertrigo will be treated with Mycolog cream to apply 3 times a day for 7-10 days. Patient will be referred to the plastic surgeon for breast reduction consideration.

## 2015-04-30 ENCOUNTER — Ambulatory Visit: Payer: Managed Care, Other (non HMO) | Admitting: Women's Health

## 2015-05-18 ENCOUNTER — Other Ambulatory Visit: Payer: Self-pay | Admitting: Gynecology

## 2015-05-18 NOTE — Telephone Encounter (Signed)
Needs annual exam in November

## 2015-07-27 ENCOUNTER — Encounter: Payer: Self-pay | Admitting: Gynecology

## 2015-07-27 ENCOUNTER — Telehealth: Payer: Self-pay | Admitting: *Deleted

## 2015-07-27 ENCOUNTER — Ambulatory Visit (INDEPENDENT_AMBULATORY_CARE_PROVIDER_SITE_OTHER): Payer: BLUE CROSS/BLUE SHIELD | Admitting: Gynecology

## 2015-07-27 VITALS — BP 108/64 | Ht 60.0 in | Wt 186.4 lb

## 2015-07-27 DIAGNOSIS — E663 Overweight: Secondary | ICD-10-CM

## 2015-07-27 DIAGNOSIS — Z01419 Encounter for gynecological examination (general) (routine) without abnormal findings: Secondary | ICD-10-CM

## 2015-07-27 DIAGNOSIS — Z23 Encounter for immunization: Secondary | ICD-10-CM

## 2015-07-27 DIAGNOSIS — R635 Abnormal weight gain: Secondary | ICD-10-CM

## 2015-07-27 DIAGNOSIS — Z8632 Personal history of gestational diabetes: Secondary | ICD-10-CM

## 2015-07-27 LAB — CBC WITH DIFFERENTIAL/PLATELET
Basophils Absolute: 0 10*3/uL (ref 0.0–0.1)
Basophils Relative: 0 % (ref 0–1)
EOS PCT: 2 % (ref 0–5)
Eosinophils Absolute: 0.2 10*3/uL (ref 0.0–0.7)
HEMATOCRIT: 39.6 % (ref 36.0–46.0)
Hemoglobin: 12.8 g/dL (ref 12.0–15.0)
LYMPHS PCT: 22 % (ref 12–46)
Lymphs Abs: 1.9 10*3/uL (ref 0.7–4.0)
MCH: 30.2 pg (ref 26.0–34.0)
MCHC: 32.3 g/dL (ref 30.0–36.0)
MCV: 93.4 fL (ref 78.0–100.0)
MONO ABS: 0.3 10*3/uL (ref 0.1–1.0)
MPV: 10 fL (ref 8.6–12.4)
Monocytes Relative: 4 % (ref 3–12)
Neutro Abs: 6.1 10*3/uL (ref 1.7–7.7)
Neutrophils Relative %: 72 % (ref 43–77)
Platelets: 344 10*3/uL (ref 150–400)
RBC: 4.24 MIL/uL (ref 3.87–5.11)
RDW: 13.9 % (ref 11.5–15.5)
WBC: 8.5 10*3/uL (ref 4.0–10.5)

## 2015-07-27 LAB — COMPREHENSIVE METABOLIC PANEL
ALT: 13 U/L (ref 6–29)
AST: 15 U/L (ref 10–30)
Albumin: 4.4 g/dL (ref 3.6–5.1)
Alkaline Phosphatase: 69 U/L (ref 33–115)
BUN: 13 mg/dL (ref 7–25)
CALCIUM: 9.4 mg/dL (ref 8.6–10.2)
CHLORIDE: 100 mmol/L (ref 98–110)
CO2: 34 mmol/L — AB (ref 20–31)
Creat: 0.66 mg/dL (ref 0.50–1.10)
GLUCOSE: 82 mg/dL (ref 65–99)
POTASSIUM: 4.5 mmol/L (ref 3.5–5.3)
Sodium: 140 mmol/L (ref 135–146)
Total Bilirubin: 0.5 mg/dL (ref 0.2–1.2)
Total Protein: 7.6 g/dL (ref 6.1–8.1)

## 2015-07-27 LAB — LIPID PANEL
CHOL/HDL RATIO: 3.3 ratio (ref <=5.0)
Cholesterol: 166 mg/dL (ref 125–200)
HDL: 50 mg/dL (ref >=46)
LDL CALC: 102 mg/dL (ref <130)
Triglycerides: 70 mg/dL (ref <150)
VLDL: 14 mg/dL (ref <30)

## 2015-07-27 LAB — TSH: TSH: 0.788 u[IU]/mL (ref 0.350–4.500)

## 2015-07-27 MED ORDER — NALTREXONE-BUPROPION HCL ER 8-90 MG PO TB12: ORAL_TABLET | ORAL | Status: DC

## 2015-07-27 NOTE — Telephone Encounter (Signed)
-----   Message from Ok EdwardsJuan H Fernandez, MD sent at 07/27/2015  9:40 AM EST ----- Appointment for nutritional  guidance overweight patient with past history of gestational diabetes and family history of disabetes

## 2015-07-27 NOTE — Patient Instructions (Addendum)
Patient information: High cholesterol (The Basics)  What is cholesterol? - Cholesterol is a substance that is found in the blood. Everyone has some. It is needed for good health. The problem is, people sometimes have too much cholesterol. Compared with people with normal cholesterol, people with high cholesterol have a higher risk of heart attacks, strokes, and other health problems. The higher your cholesterol, the higher your risk of these problems. Cholesterol levels in your body are determined significantly by your diet. Cholesterol levels may also be related to heart disease. The following material helps to explain this relationship and discusses what you can do to help keep your heart healthy. Not all cholesterol is bad. Low-density lipoprotein (LDL) cholesterol is the "bad" cholesterol. It may cause fatty deposits to build up inside your arteries. High-density lipoprotein (HDL) cholesterol is "good." It helps to remove the "bad" LDL cholesterol from your blood. Cholesterol is a very important risk factor for heart disease. Other risk factors are high blood pressure, smoking, stress, heredity, and weight.  The heart muscle gets its supply of blood through the coronary arteries. If your LDL cholesterol is high and your HDL cholesterol is low, you are at risk for having fatty deposits build up in your coronary arteries. This leaves less room through which blood can flow. Without sufficient blood and oxygen, the heart muscle cannot function properly and you may feel chest pains (angina pectoris). When a coronary artery closes up entirely, a part of the heart muscle may die, causing a heart attack (myocardial infarction).  CHECKING CHOLESTEROL When your caregiver sends your blood to a lab to be analyzed for cholesterol, a complete lipid (fat) profile may be done. With this test, the total amount of cholesterol and levels of LDL and HDL are determined. Triglycerides  are a type of fat that circulates in the blood and can also be used to determine heart disease risk. Are there different types of cholesterol? - Yes, there are a few different types. If you get a cholesterol test, you may hear your doctor or nurse talk about: Total cholesterol  LDL cholesterol - Some people call this the "bad" cholesterol. That's because having high LDL levels raises your risk of heart attacks, strokes, and other health problems.  HDL cholesterol - Some people call this the "good" cholesterol. That's because having high HDL levels lowers your risk of heart attacks, strokes, and other health problems.  Non-HDL cholesterol - Non-HDL cholesterol is your total cholesterol minus your HDL cholesterol.  Triglycerides - Triglycerides are not cholesterol. They are a type of fat. But they often get measured when cholesterol is measured. (Having high triglycerides also seems to increase the risk of heart attacks and strokes.)   Keep in mind, though, that many people who cannot meet these goals still have a low risk of heart attacks and strokes. What should I do if my doctor tells me I have high cholesterol? - Ask your doctor what your overall risk of heart attacks and strokes is. High cholesterol, by itself, is not always a reason to worry. Having high cholesterol is just one of many things that can increase your risk of heart attacks and strokes. Other factors that increase your risk include:  Cigarette smoking  High blood pressure  Having a parent, sister, or brother who got heart disease at a young age (Young, in this case, means younger than 55 for men and younger than 65 for women.)  Being a man (Women are at risk, too, but men   have a higher risk.)  Older age  If you are at high risk of heart attacks and strokes, having high cholesterol is a problem. On the other hand, if you have are at low risk, having high cholesterol may not mean much. Should I take medicine to lower cholesterol? - Not  everyone who has high cholesterol needs medicines. Your doctor or nurse will decide if you need them based on your age, family history, and other health concerns.  You should probably take a cholesterol-lowering medicine called a statin if you: Already had a heart attack or stroke  Have known heart disease  Have diabetes  Have a condition called peripheral artery disease, which makes it painful to walk, and happens when the arteries in your legs get clogged with fatty deposits  Have an abdominal aortic aneurysm, which is a widening of the main artery in the belly  Most people with any of the conditions listed above should take a statin no matter what their cholesterol level is. If your doctor or nurse puts you on a statin, stay on it. The medicine may not make you feel any different. But it can help prevent heart attacks, strokes, and death.  Can I lower my cholesterol without medicines? - Yes, you can lower your cholesterol some by:  Avoiding red meat, butter, fried foods, cheese, and other foods that have a lot of saturated fat  Losing weight (if you are overweight)  Being more active Even if these steps do little to change your cholesterol, they can improve your health in many ways.                                                   Cholesterol Control Diet  CONTROLLING CHOLESTEROL WITH DIET Although exercise and lifestyle factors are important, your diet is key. That is because certain foods are known to raise cholesterol and others to lower it. The goal is to balance foods for their effect on cholesterol and more importantly, to replace saturated and trans fat with other types of fat, such as monounsaturated fat, polyunsaturated fat, and omega-3 fatty acids. On average, a person should consume no more than 15 to 17 g of saturated fat daily. Saturated and trans fats are considered "bad" fats, and they will raise LDL cholesterol. Saturated fats are primarily found in animal products such as  meats, butter, and cream. However, that does not mean you need to sacrifice all your favorite foods. Today, there are good tasting, low-fat, low-cholesterol substitutes for most of the things you like to eat. Choose low-fat or nonfat alternatives. Choose round or loin cuts of red meat, since these types of cuts are lowest in fat and cholesterol. Chicken (without the skin), fish, veal, and ground turkey breast are excellent choices. Eliminate fatty meats, such as hot dogs and salami. Even shellfish have little or no saturated fat. Have a 3 oz (85 g) portion when you eat lean meat, poultry, or fish. Trans fats are also called "partially hydrogenated oils." They are oils that have been scientifically manipulated so that they are solid at room temperature resulting in a longer shelf life and improved taste and texture of foods in which they are added. Trans fats are found in stick margarine, some tub margarines, cookies, crackers, and baked goods.  When baking and cooking, oils are an excellent substitute   for butter. The monounsaturated oils are especially beneficial since it is believed they lower LDL and raise HDL. The oils you should avoid entirely are saturated tropical oils, such as coconut and palm.  Remember to eat liberally from food groups that are naturally free of saturated and trans fat, including fish, fruit, vegetables, beans, grains (barley, rice, couscous, bulgur wheat), and pasta (without cream sauces).  IDENTIFYING FOODS THAT LOWER CHOLESTEROL  Soluble fiber may lower your cholesterol. This type of fiber is found in fruits such as apples, vegetables such as broccoli, potatoes, and carrots, legumes such as beans, peas, and lentils, and grains such as barley. Foods fortified with plant sterols (phytosterol) may also lower cholesterol. You should eat at least 2 g per day of these foods for a cholesterol lowering effect.  Read package labels to identify low-saturated fats, trans fats free, and  low-fat foods at the supermarket. Select cheeses that have only 2 to 3 g saturated fat per ounce. Use a heart-healthy tub margarine that is free of trans fats or partially hydrogenated oil. When buying baked goods (cookies, crackers), avoid partially hydrogenated oils. Breads and muffins should be made from whole grains (whole-wheat or whole oat flour, instead of "flour" or "enriched flour"). Buy non-creamy canned soups with reduced salt and no added fats.  FOOD PREPARATION TECHNIQUES  Never deep-fry. If you must fry, either stir-fry, which uses very little fat, or use non-stick cooking sprays. When possible, broil, bake, or roast meats, and steam vegetables. Instead of dressing vegetables with butter or margarine, use lemon and herbs, applesauce and cinnamon (for squash and sweet potatoes), nonfat yogurt, salsa, and low-fat dressings for salads.  LOW-SATURATED FAT / LOW-FAT FOOD SUBSTITUTES Meats / Saturated Fat (g)  Avoid: Steak, marbled (3 oz/85 g) / 11 g   Choose: Steak, lean (3 oz/85 g) / 4 g   Avoid: Hamburger (3 oz/85 g) / 7 g   Choose: Hamburger, lean (3 oz/85 g) / 5 g   Avoid: Ham (3 oz/85 g) / 6 g   Choose: Ham, lean cut (3 oz/85 g) / 2.4 g   Avoid: Chicken, with skin, dark meat (3 oz/85 g) / 4 g   Choose: Chicken, skin removed, dark meat (3 oz/85 g) / 2 g   Avoid: Chicken, with skin, light meat (3 oz/85 g) / 2.5 g   Choose: Chicken, skin removed, light meat (3 oz/85 g) / 1 g  Dairy / Saturated Fat (g)  Avoid: Whole milk (1 cup) / 5 g   Choose: Low-fat milk, 2% (1 cup) / 3 g   Choose: Low-fat milk, 1% (1 cup) / 1.5 g   Choose: Skim milk (1 cup) / 0.3 g   Avoid: Hard cheese (1 oz/28 g) / 6 g   Choose: Skim milk cheese (1 oz/28 g) / 2 to 3 g   Avoid: Cottage cheese, 4% fat (1 cup) / 6.5 g   Choose: Low-fat cottage cheese, 1% fat (1 cup) / 1.5 g   Avoid: Ice cream (1 cup) / 9 g   Choose: Sherbet (1 cup) / 2.5 g   Choose: Nonfat frozen yogurt (1 cup) / 0.3 g    Choose: Frozen fruit bar / trace   Avoid: Whipped cream (1 tbs) / 3.5 g   Choose: Nondairy whipped topping (1 tbs) / 1 g  Condiments / Saturated Fat (g)  Avoid: Mayonnaise (1 tbs) / 2 g   Choose: Low-fat mayonnaise (1 tbs) / 1 g   Avoid:   Butter (1 tbs) / 7 g   Choose: Extra light margarine (1 tbs) / 1 g   Avoid: Coconut oil (1 tbs) / 11.8 g   Choose: Olive oil (1 tbs) / 1.8 g   Choose: Corn oil (1 tbs) / 1.7 g   Choose: Safflower oil (1 tbs) / 1.2 g   Choose: Sunflower oil (1 tbs) / 1.4 g   Choose: Soybean oil (1 tbs) / 2.4 g   Choose: Canola oil (1 tbs) / 1 g  Exercise to Lose Weight Exercise and a healthy diet may help you lose weight. Your doctor may suggest specific exercises. EXERCISE IDEAS AND TIPS  Choose low-cost things you enjoy doing, such as walking, bicycling, or exercising to workout videos.   Take stairs instead of the elevator.   Walk during your lunch break.   Park your car further away from work or school.   Go to a gym or an exercise class.   Start with 5 to 10 minutes of exercise each day. Build up to 30 minutes of exercise 4 to 6 days a week.   Wear shoes with good support and comfortable clothes.   Stretch before and after working out.   Work out until you breathe harder and your heart beats faster.   Drink extra water when you exercise.   Do not do so much that you hurt yourself, feel dizzy, or get very short of breath.  Exercises that burn about 150 calories:  Running 1  miles in 15 minutes.   Playing volleyball for 45 to 60 minutes.   Washing and waxing a car for 45 to 60 minutes.   Playing touch football for 45 minutes.   Walking 1  miles in 35 minutes.   Pushing a stroller 1  miles in 30 minutes.   Playing basketball for 30 minutes.   Raking leaves for 30 minutes.   Bicycling 5 miles in 30 minutes.   Walking 2 miles in 30 minutes.   Dancing for 30 minutes.   Shoveling snow for 15 minutes.   Swimming laps  for 20 minutes.   Walking up stairs for 15 minutes.   Bicycling 4 miles in 15 minutes.   Gardening for 30 to 45 minutes.   Jumping rope for 15 minutes.   Washing windows or floors for 45 to 60 minutes.  Document Released: 09/24/2010 Document Revised: 05/04/2011 Document Reviewed: 09/24/2010 [facility] Patient Information 2012 [place], [place].                                          Bupropion; Naltrexone extended-release tablets What is this medicine? BUPROPION; NALTREXONE (byoo PROE pee on; nal TREX one) is a combination product used to promote and maintain weight loss in obese adults or overweight adults who also have weight related medical problems. This medicine should be used with a reduced calorie diet and increased physical activity. This medicine may be used for other purposes; ask your health care provider or pharmacist if you have questions. What should I tell my health care provider before I take this medicine? They need to know if you have any of these conditions: -an eating disorder, such as anorexia or bulimia -diabetes -glaucoma -head injury -heart disease -high blood pressure -history of a drug or alcohol abuse problem -history of a tumor or infection of your brain or spine -history of stroke -history of  irregular heartbeat -kidney disease -liver disease -mental illness such as bipolar disorder or psychosis -seizures -suicidal thoughts, plans, or attempt; a previous suicide attempt by you or a family member -an unusual or allergic reaction to bupropion, naltrexone, other medicines, foods, dyes, or preservatives breast-feeding -pregnant or trying to become pregnant How should I use this medicine? Take this medicine by mouth with a glass of water. Follow the directions on the prescription label. Take this medicine in the morning and in the evenings as directed by your healthcare professional. Bonita QuinYou can take it with or without food. Do not take with high-fat meals as  this may increase your risk of seizures. Do not crush, chew, or cut these tablets. Do not take your medicine more often than directed. Do not stop taking this medicine suddenly except upon the advice of your doctor. A special MedGuide will be given to you by the pharmacist with each prescription and refill. Be sure to read this information carefully each time. Talk to your pediatrician regarding the use of this medicine in children. Special care may be needed. Overdosage: If you think you have taken too much of this medicine contact a poison control center or emergency room at once. NOTE: This medicine is only for you. Do not share this medicine with others. What if I miss a dose? If you miss a dose, skip the missed dose and take your next tablet at the regular time. Do not take double or extra doses. What may interact with this medicine? Do not take this medicine with any of the following medications: -any prescription or street opioid drug like codiene, heroin, methadone -linezolid -MAOIs like Carbex, Eldepryl, Marplan, Nardil, and Parnate -methylene blue (injected into a vein) -other medicines that contain bupropion like Zyban or Wellbutrin This medicine may also interact with the following medications: -alcohol -certain medicines for anxiety or sleep -certain medicines for blood pressure like metoprolol, propranolol -certain medicines for depression or psychotic disturbances -certain medicines for HIV or AIDS like efavirenz, lopinavir, nelfinavir, ritonavir -certain medicines for irregular heart beat like propafenone, flecainide -certain medicines for Parkinson's disease like amantadine, levodopa -certain medicines for seizures like carbamazepine, phenytoin, phenobarbital -cimetidine -clopidogrel -cyclophosphamide -disulfiram -furazolidone -isoniazid -nicotine -orphenadrine -procarbazine -steroid medicines like prednisone or cortisone -stimulant medicines for attention disorders,  weight loss, or to stay awake -tamoxifen -theophylline -thioridazine -thiotepa -ticlopidine -tramadol -warfarin This list may not describe all possible interactions. Give your health care provider a list of all the medicines, herbs, non-prescription drugs, or dietary supplements you use. Also tell them if you smoke, drink alcohol, or use illegal drugs. Some items may interact with your medicine. What should I watch for while using this medicine? This medicine is intended to be used in addition to a healthy diet and appropriate exercise. The best results are achieved this way. Do not increase or in any way change your dose without consulting your doctor or health care professional. Do not take this medicine with other prescription or over-the-counter weight loss products without consulting your doctor or health care professional. Your doctor should tell you to stop taking this medicine if you do not lose a certain amount of weight within the first 12 weeks of treatment. Visit your doctor or health care professional for regular checkups. Your doctor may order blood tests or other tests to see how you are doing. This medicine may affect blood sugar levels. If you have diabetes, check with your doctor or health care professional before you change your diet or the  dose of your diabetic medicine. Patients and their families should watch out for new or worsening depression or thoughts of suicide. Also watch out for sudden changes in feelings such as feeling anxious, agitated, panicky, irritable, hostile, aggressive, impulsive, severely restless, overly excited and hyperactive, or not being able to sleep. If this happens, especially at the beginning of treatment or after a change in dose, call your health care professional. Avoid alcoholic drinks while taking this medicine. Drinking large amounts of alcoholic beverages, using sleeping or anxiety medicines, or quickly stopping the use of these agents while  taking this medicine may increase your risk for a seizure. What side effects may I notice from receiving this medicine? Side effects that you should report to your doctor or health care professional as soon as possible: -allergic reactions like skin rash, itching or hives, swelling of the face, lips, or tongue -breathing problems -changes in vision, hearing -chest pain -confusion -dark urine -depressed mood -fast or irregular heart beat -fever -hallucination, loss of contact with reality -increased blood pressure -light-colored stools -redness, blistering, peeling or loosening of the skin, including inside the mouth -right upper belly pain -seizures -suicidal thoughts or other mood changes -unusually weak or tired -vomiting -yellowing of the eyes or skin Side effects that usually do not require medical attention (Report these to your doctor or health care professional if they continue or are bothersome.): -constipation -diarrhea -dizziness -dry mouth -headache -nausea -trouble sleeping This list may not describe all possible side effects. Call your doctor for medical advice about side effects. You may report side effects to FDA at 1-800-FDA-1088. Where should I keep my medicine? Keep out of the reach of children. Store at room temperature between 15 and 30 degrees C (59 and 86 degrees F). Throw away any unused medicine after the expiration date. NOTE: This sheet is a summary. It may not cover all possible information. If you have questions about this medicine, talk to your doctor, pharmacist, or health care provider.    2016, [employer] Standard. (2013-05-29 15:17:29)

## 2015-07-27 NOTE — Progress Notes (Signed)
Anne BoozerGretina L Calhoun 01-20-1976 [id number]   History:    39 y.o.  for annual gyn exam  With no complaints today. She reports normal menstrual cycles. Patient has gain significant weight from last year. She was weighing  171 pounds with a BMI of 32.3  And today she was weighing 186 pounds with a BMI of 36.40 kg/m. Patient states that she has a strong family history of diabetes on her mother's side and she herself had gestational diabetes during her last pregnancy. She also wanted to have her flu vaccine today. Patient with no previous history of any abnormal Pap smears.  Past medical history,surgical history, family history and social history were all reviewed and documented in the EPIC chart.  Gynecologic History Patient's last menstrual period was 06/30/2015 (approximate). Contraception: tubal ligation Last Pap:  2014. Results were: normal Last mammogram:  Not indicated. Results were: normal  Obstetric History OB History  Gravida Para Term Preterm AB SAB TAB Ectopic Multiple Living  2 2 2  0 0 0 0 0 0 2    # Outcome Date GA Lbr Len/2nd Weight Sex Delivery Anes PTL Lv  2 Term 01/19/12 [id number]  6 lb 1.4 oz (2.761 kg) F CS-LTranv Spinal  Y  1 Term     F CS-Unspec          ROS: A ROS was performed and pertinent positives and negatives are included in the history.  GENERAL: No fevers or chills. HEENT: No change in vision, no earache, sore throat or sinus congestion. NECK: No pain or stiffness. CARDIOVASCULAR: No chest pain or pressure. No palpitations. PULMONARY: No shortness of breath, cough or wheeze. GASTROINTESTINAL: No abdominal pain, nausea, vomiting or diarrhea, melena or bright red blood per rectum. GENITOURINARY: No urinary frequency, urgency, hesitancy or dysuria. MUSCULOSKELETAL: No joint or muscle pain, no back pain, no recent trauma. DERMATOLOGIC: No rash, no itching, no lesions. ENDOCRINE: No polyuria, polydipsia, no heat or cold intolerance. No recent change in weight.  HEMATOLOGICAL: No anemia or easy bruising or bleeding. NEUROLOGIC: No headache, seizures, numbness, tingling or weakness. PSYCHIATRIC: No depression, no loss of interest in normal activity or change in sleep pattern.     Exam: chaperone present  BP 108/64 mmHg  Ht 5' (1.524 m)  Wt 186 lb 6.4 oz (84.55 kg)  BMI 36.40 kg/m2  LMP 06/30/2015 (Approximate)  Body mass index is 36.4 kg/(m^2).  General appearance : Well developed well nourished female. No acute distress HEENT: Eyes: no retinal hemorrhage or exudates,  Neck supple, trachea midline, no carotid bruits, no thyroidmegaly Lungs: Clear to auscultation, no rhonchi or wheezes, or rib retractions  Heart: Regular rate and rhythm, no murmurs or gallops Breast:Examined in sitting and supine position were symmetrical in appearance, no palpable masses or tenderness,  no skin retraction, no nipple inversion, no nipple discharge, no skin discoloration, no axillary or supraclavicular lymphadenopathy Abdomen: no palpable masses or tenderness, no rebound or guarding Extremities: no edema or skin discoloration or tenderness  Pelvic:  Bartholin, Urethra, Skene Glands: Within normal limits             Vagina: No gross lesions or discharge  Cervix: No gross lesions or discharge  Uterus   anteverted, normal size, shape and consistency, non-tender and mobile  Adnexa  Without masses or tenderness  Anus and perineum  normal   Rectovaginal  normal sphincter tone without palpated masses or tenderness             Hemoccult  Not indicated     Assessment/Plan:  39 y.o. female for annual exam  Who is gaining significant amount of weight since last year. Patient was drawn family history of diabetes. Patient is fasting today so we will check the following blood were: Comprehensive metabolic panel, fasting lipid profile, TSH, CBC, and urinalysis. She is going to be referred to the [facility] nutritional center for dietary counseling and guidance appeared we also  discussed  About Contrave ( Naltrexone HCL/bupropion HCL) 8 mg/90 mg  Extended release tablet as an appetite suppressant. She was provided with a savings discount card. The risks benefits and pros and cons of the medication were discussed. We also discussed importance of exercise 1 hour at least 3 times a week. If she continues to increase in her weight by next year we may need to referred to the general surgeon for consideration bariatric surgery. Pap smear not done today. Patient received the flu vaccine today.   Ok Edwards MD, 9:51 AM 07/27/2015

## 2015-07-27 NOTE — Telephone Encounter (Signed)
Medication approved until 11/24/15

## 2015-07-27 NOTE — Telephone Encounter (Signed)
Referral placed they will contact pt to schedule. 

## 2015-07-27 NOTE — Progress Notes (Signed)
Contrave prescription called in - the prescription printed and wasn't realized until after pt left. [facility] KW CMA

## 2015-07-27 NOTE — Addendum Note (Signed)
Addended by: Richardson ChiquitoWILKINSON, Katryn Plummer S on: 07/27/2015 10:09 AM   Modules accepted: Orders

## 2015-07-27 NOTE — Telephone Encounter (Signed)
PA done for Contrave ER 8-90 mg tablet, medication approved per [facility].

## 2015-07-28 ENCOUNTER — Other Ambulatory Visit: Payer: Self-pay | Admitting: Gynecology

## 2015-07-28 ENCOUNTER — Encounter: Payer: Managed Care, Other (non HMO) | Admitting: Women's Health

## 2015-07-28 DIAGNOSIS — R3129 Other microscopic hematuria: Secondary | ICD-10-CM

## 2015-07-28 LAB — URINALYSIS W MICROSCOPIC + REFLEX CULTURE
BILIRUBIN URINE: NEGATIVE
Bacteria, UA: NONE SEEN [HPF]
CRYSTALS: NONE SEEN [HPF]
Casts: NONE SEEN [LPF]
GLUCOSE, UA: NEGATIVE
Hgb urine dipstick: NEGATIVE
KETONES UR: NEGATIVE
LEUKOCYTES UA: NEGATIVE
NITRITE: NEGATIVE
SPECIFIC GRAVITY, URINE: 1.025 (ref 1.001–1.035)
Yeast: NONE SEEN [HPF]
pH: 7.5 (ref 5.0–8.0)

## 2015-07-29 ENCOUNTER — Other Ambulatory Visit: Payer: Self-pay | Admitting: Gynecology

## 2015-07-29 MED ORDER — NITROFURANTOIN MONOHYD MACRO 100 MG PO CAPS: 100.0000 mg | ORAL_CAPSULE | Freq: Two times a day (BID) | ORAL | Status: DC

## 2015-07-30 LAB — URINE CULTURE: Colony Count: >=100000

## 2015-08-03 NOTE — Telephone Encounter (Signed)
Appointment 08/20/16 @ 12:30pm

## 2015-08-12 ENCOUNTER — Encounter: Payer: Self-pay | Admitting: Dietician

## 2015-08-12 ENCOUNTER — Encounter: Payer: BLUE CROSS/BLUE SHIELD | Attending: Gynecology | Admitting: Dietician

## 2015-08-12 VITALS — Ht 60.0 in | Wt 188.5 lb

## 2015-08-12 DIAGNOSIS — E669 Obesity, unspecified: Secondary | ICD-10-CM

## 2015-08-12 NOTE — Patient Instructions (Addendum)
Plan to have chips 2 x week (during the game and 1 x week with lunch). Other days during the week, try having salad or fruit with sandwich at lunch. Try to have breakfast in the morning - bring oatmeal, [facility] Protein bar, yogurt, or protein shake with fruit (Premier Protein Shake).  Have chocolate at work 2 x week.  Plan to drink more water. Plan to get some flavor packets. Have a cup with a straw. Limit juice and Kool Aid. Plan to join the [facility] in January and plan to do the group exercise classes (look for early morning or right after work).

## 2015-08-12 NOTE — Progress Notes (Signed)
Medical Nutrition Therapy:  Appt start time: 1540 end time:  1630.   Assessment:  Primary concerns today: Anne Calhoun is referred here today for obesity. Talked to doctor about weight loss and was prescribed Contrave and will be starting to take it tomorrow. Was prescribed enough medication to take Contrave for a year. Would like to lose about 35 lbs. Weight has been fluctuating recently. Has gained about 10-15 lbs this year. Lost weight last year by staying away from carbs and high fat foods. Found the diet hard to keep up with and had some stress this year. Stress level is getting better now and feels like the appointmnt with her doctor was a "wake up call" since she didn't realize how much weight she had gained. Feels like she needs to add some exercise. Likes salty, crunchy, salty snacks and likes to snack in general. Was having more things like snap peas and whole grain crackers last year when she was trying to avoid chips. Likes cheese.   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 Also has a part time job at [employer] at nights and weekends. Hoping that she will be able to cut back on part time hours in the spring. Lives with her husband and 2 children. She does most of the meal preparation but husband does help. Husband will bring in "junk" food sometimes that can be a temptation. Does not eat breakfast. Eats out 2-3 x per week.   Feels like she needs to go back to her plan of lean protein and having more fruits and vegetables.   Preferred Learning Style:   No preference indicated   Learning Readiness:   Ready  MEDICATIONS: see list   DIETARY INTAKE:  Usual eating pattern includes 2 meals and 0-2 snacks per day.  Avoided foods include: root vegetables (exept potatoes), eggs    24-hr recall:  B ( AM): coffee with 1 splenda/truvia and 2 creams sometimes flavored  Snk ( AM): sometimes might have a [employer] Protein bar   L ( PM):  Leftovers or sandwich (cold  cut or tuna on wheat bread/flat bread) from home or [place] with chips Snk ( PM): none or candy, chips  D ( PM): chili with [place] or shrimp with pasta and spinach/tomatoes or pot roast with mashed potatoes and broccoli or on weekends might go to [employer] and will have fish or chicken or pasta with cheese  Snk ( PM): none Beverages: 8-16 oz coffee, water, 12-16 oz Kool Aid/juice, Diet Mountain Dew/Coke at night when working  Usual physical activity: none  Estimated energy needs: 1800 calories 200 g carbohydrates 135 g protein 50 g fat  Progress Towards Goal(s):  In progress.   Nutritional Diagnosis:  [place]key-3.3 Overweight/obesity As related to hx of meal skipping and excess consumption of chips and high carb snacks.  As evidenced by BMI of 36.8 and 15 lb weight gain in past year.    Intervention:  Nutrition counseling provided. Plan: Plan to have chips 2 x week (during the game and 1 x week with lunch). Other days during the week, try having salad or fruit with sandwich at lunch. Try to have breakfast in the morning - bring oatmeal, [facility] Protein bar, yogurt, or protein shake with fruit (Premier Protein Shake).  Have chocolate at work 2 x week.  Plan to drink more water. Plan to get some flavor packets. Have a cup with a straw. Limit juice and  Kool Aid. Plan to join the [employer] in January and plan to do the group exercise classes (look for early morning or right after work).    Teaching Method Utilized:  Visual Auditory Hands on  Handouts given during visit include:  MyPlate Handout   Barriers to learning/adherence to lifestyle change: stress, busy schedule  Demonstrated degree of understanding via:  Teach Back   Monitoring/Evaluation:  Dietary intake, exercise, and body weight prn.

## 2015-08-19 ENCOUNTER — Ambulatory Visit (INDEPENDENT_AMBULATORY_CARE_PROVIDER_SITE_OTHER): Payer: Self-pay | Admitting: Gynecology

## 2015-08-19 ENCOUNTER — Encounter: Payer: Self-pay | Admitting: Gynecology

## 2015-08-19 VITALS — BP 128/86 | Wt 187.0 lb

## 2015-08-19 DIAGNOSIS — N3000 Acute cystitis without hematuria: Secondary | ICD-10-CM

## 2015-08-19 DIAGNOSIS — R635 Abnormal weight gain: Secondary | ICD-10-CM | POA: Insufficient documentation

## 2015-08-19 LAB — URINALYSIS W MICROSCOPIC + REFLEX CULTURE
Bilirubin Urine: NEGATIVE
CASTS: NONE SEEN [LPF]
Crystals: NONE SEEN [HPF]
Glucose, UA: NEGATIVE
KETONES UR: NEGATIVE
NITRITE: NEGATIVE
PH: 7.5 (ref 5.0–8.0)
Protein, ur: NEGATIVE
RBC / HPF: NONE SEEN RBC/HPF (ref <=2)
Specific Gravity, Urine: 1.015 (ref 1.001–1.035)
YEAST: NONE SEEN [HPF]

## 2015-08-19 NOTE — Progress Notes (Signed)
   Patient had presented to the office today as a result of miscommunication for my staff in reference to a follow-up Pap smear. Patient has had normal Pap smears in 2010, 2011 and 2014. When she was seen in the office on 07/27/2015 a Pap smear was not done as a result of the new guidelines. Because of her concerns of weight gain since she was currently weighing 186 pounds with a BMI of 36.40 kg/m she had been referred to the nutritionist and is currently working with her as well as the prescription that I had provided her of Contrave ( Naltrexone HCL/bupropion HCL) 8 mg/90 mg Extended release tablet as an appetite suppressant.which she started less than a week ago and is tolerating it well. At that office visit her urinalysis had demonstrated she had a urinary tract infection into microorganism identified was Escherichia coli and she was placed on Macrobid one by mouth twice a day for 7 days. She is asymptomatic today and denies any dysuria, frequency or any back pain. Her urinalysis today demonstrated 6-10 WBC no red blood cells and few bacteria culture pending. Patient will be notified if any microorganism gross and this culture.

## 2015-08-21 LAB — URINE CULTURE

## 2015-12-28 ENCOUNTER — Telehealth: Payer: Self-pay | Admitting: *Deleted

## 2015-12-28 NOTE — Telephone Encounter (Signed)
Approved until 04/26/16 per [facility]

## 2015-12-28 NOTE — Telephone Encounter (Signed)
Prior Authorization for Contrave 8-90mg  tablet was approved

## 2015-12-30 ENCOUNTER — Other Ambulatory Visit: Payer: Self-pay

## 2016-06-09 ENCOUNTER — Telehealth: Payer: Self-pay | Admitting: *Deleted

## 2016-06-09 NOTE — Telephone Encounter (Signed)
Prior Authorization for Contrave 8-90mg  tablet was approved via [facility]

## 2016-07-27 ENCOUNTER — Encounter: Payer: BLUE CROSS/BLUE SHIELD | Admitting: Gynecology

## 2016-08-09 ENCOUNTER — Encounter: Payer: Self-pay | Admitting: Women's Health

## 2016-08-09 ENCOUNTER — Ambulatory Visit (INDEPENDENT_AMBULATORY_CARE_PROVIDER_SITE_OTHER): Payer: BLUE CROSS/BLUE SHIELD | Admitting: Women's Health

## 2016-08-09 VITALS — BP 130/80 | Ht 60.0 in | Wt 190.0 lb

## 2016-08-09 DIAGNOSIS — Z01419 Encounter for gynecological examination (general) (routine) without abnormal findings: Secondary | ICD-10-CM

## 2016-08-09 DIAGNOSIS — Z1329 Encounter for screening for other suspected endocrine disorder: Secondary | ICD-10-CM | POA: Diagnosis not present

## 2016-08-09 DIAGNOSIS — Z833 Family history of diabetes mellitus: Secondary | ICD-10-CM | POA: Diagnosis not present

## 2016-08-09 DIAGNOSIS — N912 Amenorrhea, unspecified: Secondary | ICD-10-CM

## 2016-08-09 DIAGNOSIS — Z23 Encounter for immunization: Secondary | ICD-10-CM

## 2016-08-09 LAB — CBC WITH DIFFERENTIAL/PLATELET
BASOS PCT: 0 %
Basophils Absolute: 0 cells/uL (ref 0–200)
EOS ABS: 95 {cells}/uL (ref 15–500)
EOS PCT: 1 %
HCT: 40.9 % (ref 35.0–45.0)
HEMOGLOBIN: 12.7 g/dL (ref 11.7–15.5)
LYMPHS ABS: 1995 {cells}/uL (ref 850–3900)
Lymphocytes Relative: 21 %
MCH: 29.1 pg (ref 27.0–33.0)
MCHC: 31.1 g/dL — ABNORMAL LOW (ref 32.0–36.0)
MCV: 93.6 fL (ref 80.0–100.0)
MONOS PCT: 4 %
MPV: 9.9 fL (ref 7.5–12.5)
Monocytes Absolute: 380 cells/uL (ref 200–950)
NEUTROS ABS: 7030 {cells}/uL (ref 1500–7800)
Neutrophils Relative %: 74 %
PLATELETS: 353 10*3/uL (ref 140–400)
RBC: 4.37 MIL/uL (ref 3.80–5.10)
RDW: 14.8 % (ref 11.0–15.0)
WBC: 9.5 10*3/uL (ref 3.8–10.8)

## 2016-08-09 LAB — LIPID PANEL
CHOL/HDL RATIO: 3.2 ratio (ref <5.0)
CHOLESTEROL: 166 mg/dL (ref <200)
HDL: 52 mg/dL (ref >50)
LDL Cholesterol: 98 mg/dL (ref <100)
Triglycerides: 81 mg/dL (ref <150)
VLDL: 16 mg/dL (ref <30)

## 2016-08-09 LAB — TSH: TSH: 0.79 m[IU]/L

## 2016-08-09 LAB — GLUCOSE, RANDOM: GLUCOSE: 83 mg/dL (ref 65–99)

## 2016-08-09 NOTE — Progress Notes (Signed)
Anne BoozerGretina L Calhoun [birth year] [id number]    History:    Presents for annual exam.  Monthly 3-5 day cycle/BTL. Normal Pap history. History of GDM.  Past medical history, past surgical history, family history and social history were all reviewed and documented in the EPIC chart. Working part time at [employer]. Mother deceased MVA. 2 daughters ages 879 and 314 both doing well.  ROS:  A ROS was performed and pertinent positives and negatives are included.  Exam:  Vitals:   08/09/16 0953  BP: 130/80  Weight: 190 lb (86.2 kg)  Height: 5' (1.524 m)   Body mass index is 37.11 kg/m.   General appearance:  Normal Thyroid:  Symmetrical, normal in size, without palpable masses or nodularity. Respiratory  Auscultation:  Clear without wheezing or rhonchi Cardiovascular  Auscultation:  Regular rate, without rubs, murmurs or gallops  Edema/varicosities:  Not grossly evident Abdominal  Soft,nontender, without masses, guarding or rebound.  Liver/spleen:  No organomegaly noted  Hernia:  None appreciated  Skin  Inspection:  Grossly normal   Breasts: Examined lying and sitting. Pendulous breasts continues to have upper back and shoulder discomfort    Right: Without masses, retractions, discharge or axillary adenopathy.     Left: Without masses, retractions, discharge or axillary adenopathy. Gentitourinary   Inguinal/mons:  Normal without inguinal adenopathy  External genitalia:  Normal  BUS/Urethra/Skene's glands:  Normal  Vagina:  Normal  Cervix:  Normal  Uterus:   normal in size, shape and contour.  Midline and mobile  Adnexa/parametria:     Rt: Without masses or tenderness.   Lt: Without masses or tenderness.  Anus and perineum: Normal  Digital rectal exam: Normal sphincter tone without palpated masses or tenderness  Assessment/Plan:  40 y.o. MBF G2  P2  for annual exam with no complaints other than upper back and neck pain from pendulous breasts.  Monthly cycle/BTL Obesity  Plan: SBE's,  increase regular exercise, calcium rich diet, decrease simple carbs in diet for weight loss encouraged. CBC, glucose, lipid panel, TSH, UA, Pap with HR HPV typing, new screening guidelines reviewed.    Harrington ChallengerYOUNG,Blythe Veach J University Of Utah HospitalWHNP, 11:23 AM 08/09/2016

## 2016-08-09 NOTE — Patient Instructions (Signed)

## 2016-08-09 NOTE — Addendum Note (Signed)
Addended by: Kem ParkinsonBARNES, Dimetrius Montfort on: 08/09/2016 12:24 PM   Modules accepted: Orders

## 2016-08-10 ENCOUNTER — Encounter: Payer: Self-pay | Admitting: Women's Health

## 2016-08-10 LAB — PAP, TP IMAGING W/ HPV RNA, RFLX HPV TYPE 16,18/45: HPV MRNA, HIGH RISK: NOT DETECTED

## 2016-08-15 LAB — URINALYSIS W MICROSCOPIC + REFLEX CULTURE

## 2016-08-16 ENCOUNTER — Telehealth: Payer: Self-pay | Admitting: *Deleted

## 2016-08-16 NOTE — Telephone Encounter (Signed)
The form was filled out, we don't where it is  right now Bari MantisKim Alexis took care of it so I will ask her in the morning if you can let the patient no

## 2016-08-16 NOTE — Telephone Encounter (Signed)
Pt said she bought a health form that you were to fill out and mail into her husband insurance company. Do you recall getting this form last week?

## 2016-08-17 NOTE — Telephone Encounter (Signed)
Pt informed with the below note, pt said she has not heard back from the insurance company that they received the form. Pt thought the form would be mailed to her, she will wait to see if form appears in mail, if not she will re-print the form and give it to me to handle.

## 2016-11-09 DIAGNOSIS — H10021 Other mucopurulent conjunctivitis, right eye: Secondary | ICD-10-CM | POA: Diagnosis not present

## 2016-11-11 DIAGNOSIS — H1031 Unspecified acute conjunctivitis, right eye: Secondary | ICD-10-CM | POA: Diagnosis not present

## 2016-11-11 DIAGNOSIS — T7840XA Allergy, unspecified, initial encounter: Secondary | ICD-10-CM | POA: Diagnosis not present

## 2016-12-27 ENCOUNTER — Other Ambulatory Visit: Payer: Self-pay | Admitting: Gynecology

## 2017-01-18 ENCOUNTER — Encounter: Payer: Self-pay | Admitting: Gynecology

## 2017-08-10 ENCOUNTER — Encounter: Payer: BLUE CROSS/BLUE SHIELD | Admitting: Women's Health

## 2017-08-16 ENCOUNTER — Encounter: Payer: BLUE CROSS/BLUE SHIELD | Admitting: Women's Health

## 2017-08-16 DIAGNOSIS — Z0289 Encounter for other administrative examinations: Secondary | ICD-10-CM

## 2017-09-05 DIAGNOSIS — M722 Plantar fascial fibromatosis: Secondary | ICD-10-CM

## 2017-09-05 HISTORY — DX: Plantar fascial fibromatosis: M72.2

## 2017-10-03 ENCOUNTER — Encounter: Payer: Self-pay | Admitting: Women's Health

## 2017-10-03 ENCOUNTER — Ambulatory Visit (INDEPENDENT_AMBULATORY_CARE_PROVIDER_SITE_OTHER): Payer: BLUE CROSS/BLUE SHIELD | Admitting: Women's Health

## 2017-10-03 VITALS — BP 142/90 | Ht 60.0 in | Wt 197.0 lb

## 2017-10-03 DIAGNOSIS — Z13220 Encounter for screening for lipoid disorders: Secondary | ICD-10-CM

## 2017-10-03 DIAGNOSIS — Z01419 Encounter for gynecological examination (general) (routine) without abnormal findings: Secondary | ICD-10-CM | POA: Diagnosis not present

## 2017-10-03 NOTE — Patient Instructions (Addendum)
Mammogram [phone]  Breast center  [address]  Health Maintenance, Female Adopting a healthy lifestyle and getting preventive care can go a long way to promote health and wellness. Talk with your health care provider about what schedule of regular examinations is right for you. This is a good chance for you to check in with your provider about disease prevention and staying healthy. In between checkups, there are plenty of things you can do on your own. Experts have done a lot of research about which lifestyle changes and preventive measures are most likely to keep you healthy. Ask your health care provider for more information. Weight and diet Eat a healthy diet  Be sure to include plenty of vegetables, fruits, low-fat dairy products, and lean protein.  Do not eat a lot of foods high in solid fats, added sugars, or salt.  Get regular exercise. This is one of the most important things you can do for your health. ? Most adults should exercise for at least 150 minutes each week. The exercise should increase your heart rate and make you sweat (moderate-intensity exercise). ? Most adults should also do strengthening exercises at least twice a week. This is in addition to the moderate-intensity exercise.  Maintain a healthy weight  Body mass index (BMI) is a measurement that can be used to identify possible weight problems. It estimates body fat based on height and weight. Your health care provider can help determine your BMI and help you achieve or maintain a healthy weight.  For females 5 years of age and older: ? A BMI below 18.5 is considered underweight. ? A BMI of 18.5 to 24.9 is normal. ? A BMI of 25 to 29.9 is considered overweight. ? A BMI of 30 and above is considered obese.  Watch levels of cholesterol and blood lipids  You should start having your blood tested for lipids and cholesterol at 40 years of age, then have this test every 5 years.  You may need to have  your cholesterol levels checked more often if: ? Your lipid or cholesterol levels are high. ? You are older than 40 years of age. ? You are at high risk for heart disease.  Cancer screening Lung Cancer  Lung cancer screening is recommended for adults 53-31 years old who are at high risk for lung cancer because of a history of smoking.  A yearly low-dose CT scan of the lungs is recommended for people who: ? Currently smoke. ? Have quit within the past 15 years. ? Have at least a 30-pack-year history of smoking. A pack year is smoking an average of one pack of cigarettes a day for 1 year.  Yearly screening should continue until it has been 15 years since you quit.  Yearly screening should stop if you develop a health problem that would prevent you from having lung cancer treatment.  Breast Cancer  Practice breast self-awareness. This means understanding how your breasts normally appear and feel.  It also means doing regular breast self-exams. Let your health care provider know about any changes, no matter how small.  If you are in your 20s or 30s, you should have a clinical breast exam (CBE) by a health care provider every 1-3 years as part of a regular health exam.  If you are 22 or older, have a CBE every year. Also consider having a breast X-ray (mammogram) every year.  If you have a family history of breast cancer, talk to your health care  provider about genetic screening.  If you are at high risk for breast cancer, talk to your health care provider about having an MRI and a mammogram every year.  Breast cancer gene (BRCA) assessment is recommended for women who have family members with BRCA-related cancers. BRCA-related cancers include: ? Breast. ? Ovarian. ? Tubal. ? Peritoneal cancers.  Results of the assessment will determine the need for genetic counseling and BRCA1 and BRCA2 testing.  Cervical Cancer Your health care provider may recommend that you be screened  regularly for cancer of the pelvic organs (ovaries, uterus, and vagina). This screening involves a pelvic examination, including checking for microscopic changes to the surface of your cervix (Pap test). You may be encouraged to have this screening done every 3 years, beginning at age 21.  For women ages 30-65, health care providers may recommend pelvic exams and Pap testing every 3 years, or they may recommend the Pap and pelvic exam, combined with testing for human papilloma virus (HPV), every 5 years. Some types of HPV increase your risk of cervical cancer. Testing for HPV may also be done on women of any age with unclear Pap test results.  Other health care providers may not recommend any screening for nonpregnant women who are considered low risk for pelvic cancer and who do not have symptoms. Ask your health care provider if a screening pelvic exam is right for you.  If you have had past treatment for cervical cancer or a condition that could lead to cancer, you need Pap tests and screening for cancer for at least 20 years after your treatment. If Pap tests have been discontinued, your risk factors (such as having a new sexual partner) need to be reassessed to determine if screening should resume. Some women have medical problems that increase the chance of getting cervical cancer. In these cases, your health care provider may recommend more frequent screening and Pap tests.  Colorectal Cancer  This type of cancer can be detected and often prevented.  Routine colorectal cancer screening usually begins at 40 years of age and continues through 40 years of age.  Your health care provider may recommend screening at an earlier age if you have risk factors for colon cancer.  Your health care provider may also recommend using home test kits to check for hidden blood in the stool.  A small camera at the end of a tube can be used to examine your colon directly (sigmoidoscopy or colonoscopy). This is  done to check for the earliest forms of colorectal cancer.  Routine screening usually begins at age 50.  Direct examination of the colon should be repeated every 5-10 years through 40 years of age. However, you may need to be screened more often if early forms of precancerous polyps or small growths are found.  Skin Cancer  Check your skin from head to toe regularly.  Tell your health care provider about any new moles or changes in moles, especially if there is a change in a mole's shape or color.  Also tell your health care provider if you have a mole that is larger than the size of a pencil eraser.  Always use sunscreen. Apply sunscreen liberally and repeatedly throughout the day.  Protect yourself by wearing long sleeves, pants, a wide-brimmed hat, and sunglasses whenever you are outside.  Heart disease, diabetes, and high blood pressure  High blood pressure causes heart disease and increases the risk of stroke. High blood pressure is more likely to develop in: ?   People who have blood pressure in the high end of the normal range (130-139/85-89 mm Hg). ? People who are overweight or obese. ? People who are African American.  If you are 18-39 years of age, have your blood pressure checked every 3-5 years. If you are 40 years of age or older, have your blood pressure checked every year. You should have your blood pressure measured twice-once when you are at a hospital or clinic, and once when you are not at a hospital or clinic. Record the average of the two measurements. To check your blood pressure when you are not at a hospital or clinic, you can use: ? An automated blood pressure machine at a pharmacy. ? A home blood pressure monitor.  If you are between 55 years and 79 years old, ask your health care provider if you should take aspirin to prevent strokes.  Have regular diabetes screenings. This involves taking a blood sample to check your fasting blood sugar level. ? If you are  at a normal weight and have a low risk for diabetes, have this test once every three years after 40 years of age. ? If you are overweight and have a high risk for diabetes, consider being tested at a younger age or more often. Preventing infection Hepatitis B  If you have a higher risk for hepatitis B, you should be screened for this virus. You are considered at high risk for hepatitis B if: ? You were born in a country where hepatitis B is common. Ask your health care provider which countries are considered high risk. ? Your parents were born in a high-risk country, and you have not been immunized against hepatitis B (hepatitis B vaccine). ? You have HIV or AIDS. ? You use needles to inject street drugs. ? You live with someone who has hepatitis B. ? You have had sex with someone who has hepatitis B. ? You get hemodialysis treatment. ? You take certain medicines for conditions, including cancer, organ transplantation, and autoimmune conditions.  Hepatitis C  Blood testing is recommended for: ? Everyone born from 1945 through 1965. ? Anyone with known risk factors for hepatitis C.  Sexually transmitted infections (STIs)  You should be screened for sexually transmitted infections (STIs) including gonorrhea and chlamydia if: ? You are sexually active and are younger than 40 years of age. ? You are older than 40 years of age and your health care provider tells you that you are at risk for this type of infection. ? Your sexual activity has changed since you were last screened and you are at an increased risk for chlamydia or gonorrhea. Ask your health care provider if you are at risk.  If you do not have HIV, but are at risk, it may be recommended that you take a prescription medicine daily to prevent HIV infection. This is called pre-exposure prophylaxis (PrEP). You are considered at risk if: ? You are sexually active and do not regularly use condoms or know the HIV status of your  partner(s). ? You take drugs by injection. ? You are sexually active with a partner who has HIV.  Talk with your health care provider about whether you are at high risk of being infected with HIV. If you choose to begin PrEP, you should first be tested for HIV. You should then be tested every 3 months for as long as you are taking PrEP. Pregnancy  If you are premenopausal and you may become pregnant, ask your health   care provider about preconception counseling.  If you may become pregnant, take 400 to 800 micrograms (mcg) of folic acid every day.  If you want to prevent pregnancy, talk to your health care provider about birth control (contraception). Osteoporosis and menopause  Osteoporosis is a disease in which the bones lose minerals and strength with aging. This can result in serious bone fractures. Your risk for osteoporosis can be identified using a bone density scan.  If you are 70 years of age or older, or if you are at risk for osteoporosis and fractures, ask your health care provider if you should be screened.  Ask your health care provider whether you should take a calcium or vitamin D supplement to lower your risk for osteoporosis.  Menopause may have certain physical symptoms and risks.  Hormone replacement therapy may reduce some of these symptoms and risks. Talk to your health care provider about whether hormone replacement therapy is right for you. Follow these instructions at home:  Schedule regular health, dental, and eye exams.  Stay current with your immunizations.  Do not use any tobacco products including cigarettes, chewing tobacco, or electronic cigarettes.  If you are pregnant, do not drink alcohol.  If you are breastfeeding, limit how much and how often you drink alcohol.  Limit alcohol intake to no more than 1 drink per day for nonpregnant women. One drink equals 12 ounces of beer, 5 ounces of wine, or 1 ounces of hard liquor.  Do not use street  drugs.  Do not share needles.  Ask your health care provider for help if you need support or information about quitting drugs.  Tell your health care provider if you often feel depressed.  Tell your health care provider if you have ever been abused or do not feel safe at home. This information is not intended to replace advice given to you by your health care provider. Make sure you discuss any questions you have with your health care provider. Document Released: 03/07/2011 Document Revised: 01/28/2016 Document Reviewed: 05/26/2015 Elsevier Interactive Patient Education  2018 [employer].  Carbohydrate Counting for Diabetes Mellitus, Adult Carbohydrate counting is a method for keeping track of how many carbohydrates you eat. Eating carbohydrates naturally increases the amount of sugar (glucose) in the blood. Counting how many carbohydrates you eat helps keep your blood glucose within normal limits, which helps you manage your diabetes (diabetes mellitus). It is important to know how many carbohydrates you can safely have in each meal. This is different for every person. A diet and nutrition specialist (registered dietitian) can help you make a meal plan and calculate how many carbohydrates you should have at each meal and snack. Carbohydrates are found in the following foods:  Grains, such as breads and cereals.  Dried beans and soy products.  Starchy vegetables, such as potatoes, peas, and corn.  Fruit and fruit juices.  Milk and yogurt.  Sweets and snack foods, such as cake, cookies, candy, chips, and soft drinks.  How do I count carbohydrates? There are two ways to count carbohydrates in food. You can use either of the methods or a combination of both. Reading "Nutrition Facts" on packaged food The "Nutrition Facts" list is included on the labels of almost all packaged foods and beverages in the [place] It includes:  The serving size.  Information about nutrients in each  serving, including the grams (g) of carbohydrate per serving.  To use the "Nutrition Facts":  Decide how many servings you will have.  Multiply the number of servings by the number of carbohydrates per serving.  The resulting number is the total amount of carbohydrates that you will be having.  Learning standard serving sizes of other foods When you eat foods containing carbohydrates that are not packaged or do not include "Nutrition Facts" on the label, you need to measure the servings in order to count the amount of carbohydrates:  Measure the foods that you will eat with a food scale or measuring cup, if needed.  Decide how many standard-size servings you will eat.  Multiply the number of servings by 15. Most carbohydrate-rich foods have about 15 g of carbohydrates per serving. ? For example, if you eat 8 oz (170 g) of strawberries, you will have eaten 2 servings and 30 g of carbohydrates (2 servings x 15 g = 30 g).  For foods that have more than one food mixed, such as soups and casseroles, you must count the carbohydrates in each food that is included.  The following list contains standard serving sizes of common carbohydrate-rich foods. Each of these servings has about 15 g of carbohydrates:   hamburger bun or  English muffin.   oz (15 mL) syrup.   oz (14 g) jelly.  1 slice of bread.  1 six-inch tortilla.  3 oz (85 g) cooked rice or pasta.  4 oz (113 g) cooked dried beans.  4 oz (113 g) starchy vegetable, such as peas, corn, or potatoes.  4 oz (113 g) hot cereal.  4 oz (113 g) mashed potatoes or  of a large baked potato.  4 oz (113 g) canned or frozen fruit.  4 oz (120 mL) fruit juice.  4-6 crackers.  6 chicken nuggets.  6 oz (170 g) unsweetened dry cereal.  6 oz (170 g) plain fat-free yogurt or yogurt sweetened with artificial sweeteners.  8 oz (240 mL) milk.  8 oz (170 g) fresh fruit or one small piece of fruit.  24 oz (680 g) popped  popcorn.  Example of carbohydrate counting Sample meal  3 oz (85 g) chicken breast.  6 oz (170 g) brown rice.  4 oz (113 g) corn.  8 oz (240 mL) milk.  8 oz (170 g) strawberries with sugar-free whipped topping. Carbohydrate calculation 1. Identify the foods that contain carbohydrates: ? Rice. ? Corn. ? Milk. ? Strawberries. 2. Calculate how many servings you have of each food: ? 2 servings rice. ? 1 serving corn. ? 1 serving milk. ? 1 serving strawberries. 3. Multiply each number of servings by 15 g: ? 2 servings rice x 15 g = 30 g. ? 1 serving corn x 15 g = 15 g. ? 1 serving milk x 15 g = 15 g. ? 1 serving strawberries x 15 g = 15 g. 4. Add together all of the amounts to find the total grams of carbohydrates eaten: ? 30 g + 15 g + 15 g + 15 g = 75 g of carbohydrates total. This information is not intended to replace advice given to you by your health care provider. Make sure you discuss any questions you have with your health care provider. Document Released: 08/22/2005 Document Revised: 03/11/2016 Document Reviewed: 02/03/2016 Elsevier Interactive Patient Education  [employer].

## 2017-10-03 NOTE — Progress Notes (Signed)
Anne Calhoun 11/27/1975 [id number]    History: 42 yo MBF G2P2 presents for annual exam. Continues to have upper back and neck pain from pendulous breasts. Monthly 3-5 day cycle/BTL. 08/09/2016 - Normal Pap  Negative HR HPV . Normal pap history. History of GDM.   Past medical history, past surgical history, family history and social history were all reviewed and documented in the EPIC chart. 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 Mother decreased MVA. 2 daughters 3110 and 5 doing well.    ROS:  A ROS was performed and pertinent positives and negatives are included.  Exam: Appears well.  Vitals:   10/03/17 1409  BP: (!) 142/90  Weight: 197 lb (89.4 kg)  Height: 5' (1.524 m)   Body mass index is 38.47 kg/m.   General appearance:  Normal Thyroid:  Symmetrical, normal in size, without palpable masses or nodularity. Respiratory  Auscultation:  Clear without wheezing or rhonchi Cardiovascular  Auscultation:  Regular rate, without rubs, murmurs or gallops  Edema/varicosities:  Not grossly evident Abdominal  Soft,nontender, without masses, guarding or rebound.  Liver/spleen:  No organomegaly noted  Hernia:  None appreciated  Skin  Inspection:  Grossly normal   Breasts: Examined lying and sitting. Pendulous.     Right: Without masses, retractions, discharge or axillary adenopathy.     Left: Without masses, retractions, discharge or axillary adenopathy. Gentitourinary   Inguinal/mons:  Normal without inguinal adenopathy  External genitalia:  Normal  BUS/Urethra/Skene's glands:  Normal  Vagina:  Normal  Cervix:  Normal  Uterus:  normal in size, shape and contour.  Midline and mobile  Adnexa/parametria:     Rt: Without masses or tenderness.   Lt: Without masses or tenderness.  Anus and perineum: Normal  Digital rectal exam: Normal sphincter tone without palpated masses or  tenderness  Assessment/Plan:  42 y.o. MBF G2P2 for annual exam with no complaints other than upper back and neck pain from pendulous breasts.   Monthly cycle/BTL Obesity Pressure elevated   Plan: . Importance of following up with blood pressure away from office states was rushing. If continues greater than 130/8 follow-up with primary care. Breast reduction reviewed is contemplating.SBE's, yearly mammograms to be started 04/2018. Encouraged weight loss, regular exercise, and healthy low-carb diet. CBC, A1c, lipid panel.   Harrington Challengerancy J Milia Warth [facility] Mem HsptlWHNP, 2:47 PM 10/03/2017

## 2017-10-04 ENCOUNTER — Encounter: Payer: Self-pay | Admitting: *Deleted

## 2017-10-04 LAB — CBC WITH DIFFERENTIAL/PLATELET
BASOS ABS: 21 {cells}/uL (ref 0–200)
Basophils Relative: 0.2 %
EOS ABS: 135 {cells}/uL (ref 15–500)
Eosinophils Relative: 1.3 %
HEMATOCRIT: 35.8 % (ref 35.0–45.0)
HEMOGLOBIN: 11.4 g/dL — AB (ref 11.7–15.5)
LYMPHS ABS: 2236 {cells}/uL (ref 850–3900)
MCH: 28.2 pg (ref 27.0–33.0)
MCHC: 31.8 g/dL — AB (ref 32.0–36.0)
MCV: 88.6 fL (ref 80.0–100.0)
MONOS PCT: 5 %
MPV: 10.7 fL (ref 7.5–12.5)
NEUTROS ABS: 7488 {cells}/uL (ref 1500–7800)
NEUTROS PCT: 72 %
Platelets: 354 10*3/uL (ref 140–400)
RBC: 4.04 10*6/uL (ref 3.80–5.10)
RDW: 14.1 % (ref 11.0–15.0)
Total Lymphocyte: 21.5 %
WBC mixed population: 520 cells/uL (ref 200–950)
WBC: 10.4 10*3/uL (ref 3.8–10.8)

## 2017-10-04 LAB — HEMOGLOBIN A1C
Hgb A1c MFr Bld: 5.3 % of total Hgb (ref <5.7)
Mean Plasma Glucose: 105 (calc)
eAG (mmol/L): 5.8 (calc)

## 2017-10-04 LAB — LIPID PANEL
CHOL/HDL RATIO: 3.5 (calc) (ref <5.0)
Cholesterol: 161 mg/dL (ref <200)
HDL: 46 mg/dL — ABNORMAL LOW (ref >50)
LDL Cholesterol (Calc): 92 mg/dL (calc)
NON-HDL CHOLESTEROL (CALC): 115 mg/dL (ref <130)
Triglycerides: 129 mg/dL (ref <150)

## 2017-10-12 ENCOUNTER — Encounter: Payer: Self-pay | Admitting: Women's Health

## 2018-02-26 DIAGNOSIS — M722 Plantar fascial fibromatosis: Secondary | ICD-10-CM | POA: Diagnosis not present

## 2018-02-26 DIAGNOSIS — M7732 Calcaneal spur, left foot: Secondary | ICD-10-CM | POA: Diagnosis not present

## 2018-03-12 DIAGNOSIS — M7732 Calcaneal spur, left foot: Secondary | ICD-10-CM | POA: Diagnosis not present

## 2018-03-12 DIAGNOSIS — Z5181 Encounter for therapeutic drug level monitoring: Secondary | ICD-10-CM | POA: Diagnosis not present

## 2018-03-12 DIAGNOSIS — M722 Plantar fascial fibromatosis: Secondary | ICD-10-CM | POA: Diagnosis not present

## 2018-03-21 ENCOUNTER — Telehealth: Payer: Self-pay | Admitting: *Deleted

## 2018-03-21 NOTE — Telephone Encounter (Signed)
Patient called and left message in triage voicemail asking if B-12 injection Rx could be prescribed. I called and left mesage on voicemail she does not prescribed this and best to check with PCP.

## 2018-04-12 DIAGNOSIS — L03011 Cellulitis of right finger: Secondary | ICD-10-CM | POA: Diagnosis not present

## 2018-06-21 DIAGNOSIS — H6092 Unspecified otitis externa, left ear: Secondary | ICD-10-CM | POA: Diagnosis not present

## 2018-08-12 DIAGNOSIS — J069 Acute upper respiratory infection, unspecified: Secondary | ICD-10-CM | POA: Diagnosis not present

## 2018-08-12 DIAGNOSIS — R05 Cough: Secondary | ICD-10-CM | POA: Diagnosis not present

## 2018-08-12 DIAGNOSIS — Z20828 Contact with and (suspected) exposure to other viral communicable diseases: Secondary | ICD-10-CM | POA: Diagnosis not present

## 2018-10-09 ENCOUNTER — Ambulatory Visit (INDEPENDENT_AMBULATORY_CARE_PROVIDER_SITE_OTHER): Payer: BLUE CROSS/BLUE SHIELD | Admitting: Women's Health

## 2018-10-09 ENCOUNTER — Encounter: Payer: Self-pay | Admitting: Women's Health

## 2018-10-09 VITALS — BP 132/80 | Ht 60.0 in | Wt 204.4 lb

## 2018-10-09 DIAGNOSIS — Z13220 Encounter for screening for lipoid disorders: Secondary | ICD-10-CM | POA: Diagnosis not present

## 2018-10-09 DIAGNOSIS — Z01419 Encounter for gynecological examination (general) (routine) without abnormal findings: Secondary | ICD-10-CM | POA: Diagnosis not present

## 2018-10-09 NOTE — Patient Instructions (Signed)
Breast center  [phone] Health Maintenance, Female Adopting a healthy lifestyle and getting preventive care can go a long way to promote health and wellness. Talk with your health care provider about what schedule of regular examinations is right for you. This is a good chance for you to check in with your provider about disease prevention and staying healthy. In between checkups, there are plenty of things you can do on your own. Experts have done a lot of research about which lifestyle changes and preventive measures are most likely to keep you healthy. Ask your health care provider for more information. Weight and diet Eat a healthy diet  Be sure to include plenty of vegetables, fruits, low-fat dairy products, and lean protein.  Do not eat a lot of foods high in solid fats, added sugars, or salt.  Get regular exercise. This is one of the most important things you can do for your health. ? Most adults should exercise for at least 150 minutes each week. The exercise should increase your heart rate and make you sweat (moderate-intensity exercise). ? Most adults should also do strengthening exercises at least twice a week. This is in addition to the moderate-intensity exercise. Maintain a healthy weight  Body mass index (BMI) is a measurement that can be used to identify possible weight problems. It estimates body fat based on height and weight. Your health care provider can help determine your BMI and help you achieve or maintain a healthy weight.  For females 20 years of age and older: ? A BMI below 18.5 is considered underweight. ? A BMI of 18.5 to 24.9 is normal. ? A BMI of 25 to 29.9 is considered overweight. ? A BMI of 30 and above is considered obese. Watch levels of cholesterol and blood lipids  You should start having your blood tested for lipids and cholesterol at 41 years of age, then have this test every 5 years.  You may need to have your cholesterol levels checked more often  if: ? Your lipid or cholesterol levels are high. ? You are older than 41 years of age. ? You are at high risk for heart disease. Cancer screening Lung Cancer  Lung cancer screening is recommended for adults 55-80 years old who are at high risk for lung cancer because of a history of smoking.  A yearly low-dose CT scan of the lungs is recommended for people who: ? Currently smoke. ? Have quit within the past 15 years. ? Have at least a 30-pack-year history of smoking. A pack year is smoking an average of one pack of cigarettes a day for 1 year.  Yearly screening should continue until it has been 15 years since you quit.  Yearly screening should stop if you develop a health problem that would prevent you from having lung cancer treatment. Breast Cancer  Practice breast self-awareness. This means understanding how your breasts normally appear and feel.  It also means doing regular breast self-exams. Let your health care provider know about any changes, no matter how small.  If you are in your 20s or 30s, you should have a clinical breast exam (CBE) by a health care provider every 1-3 years as part of a regular health exam.  If you are 40 or older, have a CBE every year. Also consider having a breast X-ray (mammogram) every year.  If you have a family history of breast cancer, talk to your health care provider about genetic screening.  If you are at high risk   for breast cancer, talk to your health care provider about having an MRI and a mammogram every year.  Breast cancer gene (BRCA) assessment is recommended for women who have family members with BRCA-related cancers. BRCA-related cancers include: ? Breast. ? Ovarian. ? Tubal. ? Peritoneal cancers.  Results of the assessment will determine the need for genetic counseling and BRCA1 and BRCA2 testing. Cervical Cancer Your health care provider may recommend that you be screened regularly for cancer of the pelvic organs (ovaries,  uterus, and vagina). This screening involves a pelvic examination, including checking for microscopic changes to the surface of your cervix (Pap test). You may be encouraged to have this screening done every 3 years, beginning at age 21.  For women ages 30-65, health care providers may recommend pelvic exams and Pap testing every 3 years, or they may recommend the Pap and pelvic exam, combined with testing for human papilloma virus (HPV), every 5 years. Some types of HPV increase your risk of cervical cancer. Testing for HPV may also be done on women of any age with unclear Pap test results.  Other health care providers may not recommend any screening for nonpregnant women who are considered low risk for pelvic cancer and who do not have symptoms. Ask your health care provider if a screening pelvic exam is right for you.  If you have had past treatment for cervical cancer or a condition that could lead to cancer, you need Pap tests and screening for cancer for at least 20 years after your treatment. If Pap tests have been discontinued, your risk factors (such as having a new sexual partner) need to be reassessed to determine if screening should resume. Some women have medical problems that increase the chance of getting cervical cancer. In these cases, your health care provider may recommend more frequent screening and Pap tests. Colorectal Cancer  This type of cancer can be detected and often prevented.  Routine colorectal cancer screening usually begins at 41 years of age and continues through 41 years of age.  Your health care provider may recommend screening at an earlier age if you have risk factors for colon cancer.  Your health care provider may also recommend using home test kits to check for hidden blood in the stool.  A small camera at the end of a tube can be used to examine your colon directly (sigmoidoscopy or colonoscopy). This is done to check for the earliest forms of colorectal  cancer.  Routine screening usually begins at age 50.  Direct examination of the colon should be repeated every 5-10 years through 41 years of age. However, you may need to be screened more often if early forms of precancerous polyps or small growths are found. Skin Cancer  Check your skin from head to toe regularly.  Tell your health care provider about any new moles or changes in moles, especially if there is a change in a mole's shape or color.  Also tell your health care provider if you have a mole that is larger than the size of a pencil eraser.  Always use sunscreen. Apply sunscreen liberally and repeatedly throughout the day.  Protect yourself by wearing long sleeves, pants, a wide-brimmed hat, and sunglasses whenever you are outside. Heart disease, diabetes, and high blood pressure  High blood pressure causes heart disease and increases the risk of stroke. High blood pressure is more likely to develop in: ? People who have blood pressure in the high end of the normal range (130-139/85-89   mm Hg). ? People who are overweight or obese. ? People who are African American.  If you are 18-39 years of age, have your blood pressure checked every 3-5 years. If you are 40 years of age or older, have your blood pressure checked every year. You should have your blood pressure measured twice-once when you are at a hospital or clinic, and once when you are not at a hospital or clinic. Record the average of the two measurements. To check your blood pressure when you are not at a hospital or clinic, you can use: ? An automated blood pressure machine at a pharmacy. ? A home blood pressure monitor.  If you are between 55 years and 79 years old, ask your health care provider if you should take aspirin to prevent strokes.  Have regular diabetes screenings. This involves taking a blood sample to check your fasting blood sugar level. ? If you are at a normal weight and have a low risk for diabetes,  have this test once every three years after 41 years of age. ? If you are overweight and have a high risk for diabetes, consider being tested at a younger age or more often. Preventing infection Hepatitis B  If you have a higher risk for hepatitis B, you should be screened for this virus. You are considered at high risk for hepatitis B if: ? You were born in a country where hepatitis B is common. Ask your health care provider which countries are considered high risk. ? Your parents were born in a high-risk country, and you have not been immunized against hepatitis B (hepatitis B vaccine). ? You have HIV or AIDS. ? You use needles to inject street drugs. ? You live with someone who has hepatitis B. ? You have had sex with someone who has hepatitis B. ? You get hemodialysis treatment. ? You take certain medicines for conditions, including cancer, organ transplantation, and autoimmune conditions. Hepatitis C  Blood testing is recommended for: ? Everyone born from 1945 through 1965. ? Anyone with known risk factors for hepatitis C. Sexually transmitted infections (STIs)  You should be screened for sexually transmitted infections (STIs) including gonorrhea and chlamydia if: ? You are sexually active and are younger than 41 years of age. ? You are older than 41 years of age and your health care provider tells you that you are at risk for this type of infection. ? Your sexual activity has changed since you were last screened and you are at an increased risk for chlamydia or gonorrhea. Ask your health care provider if you are at risk.  If you do not have HIV, but are at risk, it may be recommended that you take a prescription medicine daily to prevent HIV infection. This is called pre-exposure prophylaxis (PrEP). You are considered at risk if: ? You are sexually active and do not regularly use condoms or know the HIV status of your partner(s). ? You take drugs by injection. ? You are sexually  active with a partner who has HIV. Talk with your health care provider about whether you are at high risk of being infected with HIV. If you choose to begin PrEP, you should first be tested for HIV. You should then be tested every 3 months for as long as you are taking PrEP. Pregnancy  If you are premenopausal and you may become pregnant, ask your health care provider about preconception counseling.  If you may become pregnant, take 400 to 800 micrograms (mcg)   of folic acid every day.  If you want to prevent pregnancy, talk to your health care provider about birth control (contraception). Osteoporosis and menopause  Osteoporosis is a disease in which the bones lose minerals and strength with aging. This can result in serious bone fractures. Your risk for osteoporosis can be identified using a bone density scan.  If you are 65 years of age or older, or if you are at risk for osteoporosis and fractures, ask your health care provider if you should be screened.  Ask your health care provider whether you should take a calcium or vitamin D supplement to lower your risk for osteoporosis.  Menopause may have certain physical symptoms and risks.  Hormone replacement therapy may reduce some of these symptoms and risks. Talk to your health care provider about whether hormone replacement therapy is right for you. Follow these instructions at home:  Schedule regular health, dental, and eye exams.  Stay current with your immunizations.  Do not use any tobacco products including cigarettes, chewing tobacco, or electronic cigarettes.  If you are pregnant, do not drink alcohol.  If you are breastfeeding, limit how much and how often you drink alcohol.  Limit alcohol intake to no more than 1 drink per day for nonpregnant women. One drink equals 12 ounces of beer, 5 ounces of wine, or 1 ounces of hard liquor.  Do not use street drugs.  Do not share needles.  Ask your health care provider for  help if you need support or information about quitting drugs.  Tell your health care provider if you often feel depressed.  Tell your health care provider if you have ever been abused or do not feel safe at home. This information is not intended to replace advice given to you by your health care provider. Make sure you discuss any questions you have with your health care provider. Document Released: 03/07/2011 Document Revised: 01/28/2016 Document Reviewed: 05/26/2015 Elsevier Interactive Patient Education  2019 [employer] Inc[employer]  

## 2018-10-09 NOTE — Progress Notes (Signed)
Anne Calhoun 10-08-1975 [id number]    History:    Presents for annual exam.  Monthly cycle/BTL.  Normal Pap history has not had a screening mammogram.  History of GDM with one pregnancy.  Past medical history, past surgical history, family history and social history were all reviewed and documented in the EPIC chart.  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  Daughters are 58 and 6 both doing well.  ROS:  A ROS was performed and pertinent positives and negatives are included.  Exam:  Vitals:   10/09/18 1010  BP: 132/80  Weight: 204 lb 6.4 oz (92.7 kg)  Height: 5' (1.524 m)   Body mass index is 39.92 kg/m.   General appearance:  Normal Thyroid:  Symmetrical, normal in size, without palpable masses or nodularity. Respiratory  Auscultation:  Clear without wheezing or rhonchi Cardiovascular  Auscultation:  Regular rate, without rubs, murmurs or gallops  Edema/varicosities:  Not grossly evident Abdominal  Soft,nontender, without masses, guarding or rebound.  Liver/spleen:  No organomegaly noted  Hernia:  None appreciated  Skin  Inspection:  Grossly normal   Breasts: Examined lying and sitting.     Right: Without masses, retractions, discharge or axillary adenopathy.     Left: Without masses, retractions, discharge or axillary adenopathy. Gentitourinary   Inguinal/mons:  Normal without inguinal adenopathy  External genitalia:  Normal  BUS/Urethra/Skene's glands:  Normal  Vagina:  Normal  Cervix:  Normal  Uterus:  normal in size, shape and contour.  Midline and mobile  Adnexa/parametria:     Rt: Without masses or tenderness.   Lt: Without masses or tenderness.  Anus and perineum: Normal  Digital rectal exam: Normal sphincter tone without palpated masses or tenderness  Assessment/Plan:  43 y.o. MBF G2 P2 or annual exam with no complaints.  Monthly cycle/BTL Morbid obesity  Plan: Reviewed importance of increasing exercise and decreasing  calorie/carbs.  SBEs, screening mammogram reviewed and encouraged breast center information given instructed to schedule.  Vitamin D 1000 daily encouraged.  CBC, CMP, lipid panel, Pap with HR HPV typing   Harrington Challenger [facility], 10:25 AM 10/09/2018

## 2018-10-10 LAB — COMPREHENSIVE METABOLIC PANEL
AG Ratio: 1.4 (calc) (ref 1.0–2.5)
ALT: 14 U/L (ref 6–29)
AST: 16 U/L (ref 10–30)
Albumin: 4.3 g/dL (ref 3.6–5.1)
Alkaline phosphatase (APISO): 69 U/L (ref 31–125)
BUN: 11 mg/dL (ref 7–25)
CALCIUM: 9.5 mg/dL (ref 8.6–10.2)
CO2: 29 mmol/L (ref 20–32)
CREATININE: 0.61 mg/dL (ref 0.50–1.10)
Chloride: 96 mmol/L — ABNORMAL LOW (ref 98–110)
Globulin: 3 g/dL (calc) (ref 1.9–3.7)
Glucose, Bld: 85 mg/dL (ref 65–99)
Potassium: 4.5 mmol/L (ref 3.5–5.3)
Sodium: 136 mmol/L (ref 135–146)
Total Bilirubin: 0.6 mg/dL (ref 0.2–1.2)
Total Protein: 7.3 g/dL (ref 6.1–8.1)

## 2018-10-10 LAB — CBC WITH DIFFERENTIAL/PLATELET
Absolute Monocytes: 455 cells/uL (ref 200–950)
BASOS PCT: 0.1 %
Basophils Absolute: 9 cells/uL (ref 0–200)
Eosinophils Absolute: 191 cells/uL (ref 15–500)
Eosinophils Relative: 2.1 %
HCT: 38.8 % (ref 35.0–45.0)
Hemoglobin: 12.6 g/dL (ref 11.7–15.5)
Lymphs Abs: 1957 cells/uL (ref 850–3900)
MCH: 29.1 pg (ref 27.0–33.0)
MCHC: 32.5 g/dL (ref 32.0–36.0)
MCV: 89.6 fL (ref 80.0–100.0)
MPV: 10.5 fL (ref 7.5–12.5)
Monocytes Relative: 5 %
Neutro Abs: 6488 cells/uL (ref 1500–7800)
Neutrophils Relative %: 71.3 %
Platelets: 354 10*3/uL (ref 140–400)
RBC: 4.33 10*6/uL (ref 3.80–5.10)
RDW: 13.4 % (ref 11.0–15.0)
TOTAL LYMPHOCYTE: 21.5 %
WBC: 9.1 10*3/uL (ref 3.8–10.8)

## 2018-10-10 LAB — PAP, TP IMAGING W/ HPV RNA, RFLX HPV TYPE 16,18/45: HPV DNA High Risk: NOT DETECTED

## 2018-10-10 LAB — LIPID PANEL
Cholesterol: 183 mg/dL (ref <200)
HDL: 51 mg/dL (ref >=50)
LDL Cholesterol (Calc): 112 mg/dL (calc) — ABNORMAL HIGH
Non-HDL Cholesterol (Calc): 132 mg/dL (calc) — ABNORMAL HIGH (ref <130)
Total CHOL/HDL Ratio: 3.6 (calc) (ref <5.0)
Triglycerides: 95 mg/dL (ref <150)

## 2019-06-28 DIAGNOSIS — N390 Urinary tract infection, site not specified: Secondary | ICD-10-CM | POA: Diagnosis not present

## 2019-10-15 ENCOUNTER — Encounter: Payer: BLUE CROSS/BLUE SHIELD | Admitting: Women's Health

## 2019-11-29 ENCOUNTER — Ambulatory Visit: Payer: BC Managed Care – PPO | Attending: Internal Medicine

## 2019-11-29 DIAGNOSIS — Z23 Encounter for immunization: Secondary | ICD-10-CM

## 2019-11-29 NOTE — Progress Notes (Signed)
   Covid-19 Vaccination Clinic  Name:  Anne Calhoun    MRN: [id number] DOB: 08/11/1976  11/29/2019  [name] was observed post Covid-19 immunization for 15 minutes without incident. She was provided with Vaccine Information Sheet and instruction to access the V-Safe system.   [name] was instructed to call 911 with any severe reactions post vaccine: Marland Kitchen Difficulty breathing  . Swelling of face and throat  . A fast heartbeat  . A bad rash all over body  . Dizziness and weakness   Immunizations Administered    Name Date Dose VIS Date Route   Pfizer COVID-19 Vaccine 11/29/2019  9:17 AM 0.3 mL 08/16/2019 Intramuscular   Manufacturer: [employer], [employer]   Lot: [id number]   NDC: [address]-1

## 2019-12-24 ENCOUNTER — Ambulatory Visit: Payer: BC Managed Care – PPO | Attending: Internal Medicine

## 2019-12-24 DIAGNOSIS — Z23 Encounter for immunization: Secondary | ICD-10-CM

## 2019-12-24 NOTE — Progress Notes (Signed)
   Covid-19 Vaccination Clinic  Name:  Anne Calhoun    MRN: [id number] DOB: 08-08-1976  12/24/2019  Anne Calhoun was observed post Covid-19 immunization for 15 minutes without incident. She was provided with Vaccine Information Sheet and instruction to access the V-Safe system.   Anne Calhoun was instructed to call 911 with any severe reactions post vaccine: Marland Kitchen Difficulty breathing  . Swelling of face and throat  . A fast heartbeat  . A bad rash all over body  . Dizziness and weakness   Immunizations Administered    Name Date Dose VIS Date Route   Pfizer COVID-19 Vaccine 12/24/2019  9:05 AM 0.3 mL 10/30/2018 Intramuscular   Manufacturer: [employer], [employer]   Lot: [id number]   NDC: [address]-6
